# Patient Record
Sex: Male | Born: 1955 | Race: Black or African American | Hispanic: No | Marital: Single | Smoking: Never smoker
Health system: Southern US, Community
[De-identification: ages and names within clinical notes are randomized; demographics above are authoritative.]

## PROBLEM LIST (undated history)

## (undated) DIAGNOSIS — G839 Paralytic syndrome, unspecified: Secondary | ICD-10-CM

## (undated) DIAGNOSIS — I10 Essential (primary) hypertension: Secondary | ICD-10-CM

## (undated) DIAGNOSIS — M4800 Spinal stenosis, site unspecified: Secondary | ICD-10-CM

## (undated) HISTORY — PX: BACK SURGERY: SHX140

---

## 2004-08-25 ENCOUNTER — Encounter: Admission: RE | Admit: 2004-08-25 | Discharge: 2004-08-25 | Payer: Self-pay | Admitting: Orthopaedic Surgery

## 2004-09-20 ENCOUNTER — Encounter: Admission: RE | Admit: 2004-09-20 | Discharge: 2004-09-20 | Payer: Self-pay | Admitting: Orthopaedic Surgery

## 2005-02-23 ENCOUNTER — Encounter
Admission: RE | Admit: 2005-02-23 | Discharge: 2005-02-23 | Payer: Self-pay | Admitting: Physical Medicine and Rehabilitation

## 2010-10-28 ENCOUNTER — Encounter: Payer: Self-pay | Admitting: Orthopaedic Surgery

## 2012-04-29 ENCOUNTER — Encounter (HOSPITAL_COMMUNITY): Payer: Self-pay

## 2012-04-29 ENCOUNTER — Emergency Department (INDEPENDENT_AMBULATORY_CARE_PROVIDER_SITE_OTHER)
Admission: EM | Admit: 2012-04-29 | Discharge: 2012-04-29 | Disposition: A | Discharge status: Home / Self Care | Payer: Medicare Other | Source: Home / Self Care | Attending: Emergency Medicine | Admitting: Emergency Medicine

## 2012-04-29 DIAGNOSIS — M4800 Spinal stenosis, site unspecified: Secondary | ICD-10-CM

## 2012-04-29 HISTORY — DX: Spinal stenosis, site unspecified: M48.00

## 2012-04-29 HISTORY — DX: Essential (primary) hypertension: I10

## 2012-04-29 MED ORDER — OXYCODONE-ACETAMINOPHEN 5-325 MG PO TABS: ORAL_TABLET | ORAL | Status: AC

## 2012-04-29 NOTE — ED Provider Notes (Signed)
Chief Complaint  Patient presents with  . Back Pain    History of Present Illness:   [name] is a 56 year old male with chronic lower back pain due to spinal stenosis. This was first operated on in 1988. He's had severe pain ever since then. He was diagnosed as having spinal stenosis 5-6 years ago. He states he hurts although her and notes lower back pain and stiffness. He has decreased range of motion and pain with just about any movement. He is confined to wheelchair and cannot stand or walk. The pain in his back radiates down into both of his legs as far as the toes with numbness and tingling and weakness in the legs. He has no difficulty urinating. He is somewhat constipated. He does note abdominal pain. The patient states he's just been taking ibuprofen and aspirin for the pain, however review of the [place] data base reveals that he's been seeing a Dr. Ellwood Sayers who has given him hydrocodone/APAP #90 once a month for the past 2 months. His last prescription for this was less than a month ago. After I confronted him about this he told me that yes, he had gotten the prescriptions but they were not doing any good so he flushed them down the commode. He also sees Dr. Andris Baumann in [facility]. He states that she has prescribed anti-inflammatories for his back pain, but they don't do any good. He comes in today hoping that I would give him a prescription for 30 day prescription for Percocet. He states this is the only thing that's ever worked. He is not seeing any chronic pain management specialist right now. He states he has taken Percocet and used fentanyl patches in the past but he decided he wanted to try to get off of these. He also has seen Dr. Noel Gerold for back pain in the past. I asked him if he could followup with Dr. Noel Gerold, but he states he would not want to see him again, but could not give me a good reason. When asked by decided to come in today, he states that he is just tired of  hurting and wants something stronger for pain.  Review of Systems:  Other than noted above, the patient denies any of the following symptoms: Systemic:  No fever, chills, fatigue, or weight loss. GI:  No abdominal pain, nausea, vomiting, diarrhea, constipation or blood in stool. GU:  No dysuria, frequency, urgency, or hematuria. No incontinence or difficulty urinating.  M-S:  No neck pain, joint pain, arthritis, or myalgias. Neuro:  No parethesias or muscular weakness. Skin:  No rash or itching.   PMFSH:  Past medical history, family history, social history, meds, and allergies were reviewed.  Physical Exam:   Vital signs:  BP 148/85  Pulse 71  Temp 98.4 F (36.9 C) (Oral)  Resp 18  SpO2 98% General:  Alert, oriented, in no distress. He is confined to wheelchair and could not stand up for a complete back exam. Abdomen:  Soft, non-tender.  No organomegaly or mass.  No pulsatile midline abdominal mass or bruit. Back:  His back is diffusely tender to palpation. There is no swelling or deformity. Neuro:  Normal muscle strength, sensations and DTRs were unobtainable. Extremities: Pedal pulses were full, there was no edema. Skin:  Clear, warm and dry.  No rash.  Assessment:  The encounter diagnosis was Spinal stenosis. I explained to him that we did not treat chronic pain here and that he would need  to find a chronic pain management specialist. I offered to give him tramadol, but he states that he cannot tolerate these because they cause seizures. He states the hydrocodone does no good motor anti-inflammatories. I did give him a prescription for 15 Percocets. But I told him he should make these last until he can get a chronic pain specialist.  Plan:   1.  The following meds were prescribed:   New Prescriptions   OXYCODONE-ACETAMINOPHEN (PERCOCET) 5-325 MG PER TABLET    1 to 2 tablets every 6 hours as needed for pain.   2.  The patient was instructed in symptomatic care and handouts were  given. 3.  The patient was told to return if becoming worse in any way, if no better in 2 weeks, and given some red flag symptoms that would indicate earlier return. 4.  The patient was encouraged to try to be as active as possible and given some exercises to do followed by moist heat.   Reuben Likes, MD 04/29/12 2026

## 2012-04-29 NOTE — ED Notes (Signed)
Reports long duration of pain in back (several years) no longer under MD care for back issues

## 2014-09-05 ENCOUNTER — Emergency Department (HOSPITAL_COMMUNITY)
Admission: EM | Admit: 2014-09-05 | Discharge: 2014-09-05 | Disposition: A | Discharge status: Home / Self Care | Payer: Medicare Other | Attending: Emergency Medicine | Admitting: Emergency Medicine

## 2014-09-05 ENCOUNTER — Encounter (HOSPITAL_COMMUNITY): Payer: Self-pay | Admitting: *Deleted

## 2014-09-05 DIAGNOSIS — L89159 Pressure ulcer of sacral region, unspecified stage: Secondary | ICD-10-CM | POA: Diagnosis not present

## 2014-09-05 DIAGNOSIS — Z8739 Personal history of other diseases of the musculoskeletal system and connective tissue: Secondary | ICD-10-CM | POA: Diagnosis not present

## 2014-09-05 DIAGNOSIS — L8990 Pressure ulcer of unspecified site, unspecified stage: Secondary | ICD-10-CM

## 2014-09-05 DIAGNOSIS — Z8669 Personal history of other diseases of the nervous system and sense organs: Secondary | ICD-10-CM | POA: Diagnosis not present

## 2014-09-05 DIAGNOSIS — Z79899 Other long term (current) drug therapy: Secondary | ICD-10-CM | POA: Insufficient documentation

## 2014-09-05 DIAGNOSIS — I10 Essential (primary) hypertension: Secondary | ICD-10-CM | POA: Insufficient documentation

## 2014-09-05 DIAGNOSIS — E119 Type 2 diabetes mellitus without complications: Secondary | ICD-10-CM | POA: Insufficient documentation

## 2014-09-05 DIAGNOSIS — L089 Local infection of the skin and subcutaneous tissue, unspecified: Secondary | ICD-10-CM | POA: Diagnosis present

## 2014-09-05 HISTORY — DX: Paralytic syndrome, unspecified: G83.9

## 2014-09-05 NOTE — Discharge Instructions (Signed)
Skin Ulcer A skin ulcer is an open sore that can be shallow or deep. Skin ulcers sometimes become infected and are difficult to treat. It may be 1 month or longer before real healing progress is made. CAUSES   Injury.  Problems with the veins or arteries.  Diabetes.  Insect bites.  Bedsores.  Inflammatory conditions. SYMPTOMS   Pain, redness, swelling, and tenderness around the ulcer.  Fever.  Bleeding from the ulcer.  Yellow or clear fluid coming from the ulcer. DIAGNOSIS  There are many types of skin ulcers. Any open sores will be examined. Certain tests will be done to determine the kind of ulcer you have. The right treatment depends on the type of ulcer you have. TREATMENT  Treatment is a long-term challenge. It may include:  Wearing an elastic wrap, compression stockings, or gel cast over the ulcer area.  Taking antibiotic medicines or putting antibiotic creams on the affected area if there is an infection. HOME CARE INSTRUCTIONS  Put on your bandages (dressings), wraps, or casts over the ulcer as directed by your caregiver.  Change all dressings as directed by your caregiver.  Take all medicines as directed by your caregiver.  Keep the affected area clean and dry.  Avoid injuries to the affected area.  Eat a well-balanced, healthy diet that includes plenty of fruit and vegetables.  If you smoke, consider quitting or decreasing the amount of cigarettes you smoke.  Once the ulcer heals, get regular exercise as directed by your caregiver.  Work with your caregiver to make sure your blood pressure, cholesterol, and diabetes are well-controlled.  Keep your skin moisturized. Dry skin can crack and lead to skin ulcers. SEEK IMMEDIATE MEDICAL CARE IF:   Your pain gets worse.  You have swelling, redness, or fluids around the ulcer.  You have chills.  You have a fever. MAKE SURE YOU:   Understand these instructions.  Will watch your condition.  Will get  help right away if you are not doing well or get worse. Document Released: 11/01/2004 Document Revised: 12/17/2011 Document Reviewed: 05/11/2011 [facility] Patient Information 2015 [place], [place]. This information is not intended to replace advice given to you by your health care provider. Make sure you discuss any questions you have with your health care provider.  Pressure Ulcer A pressure ulcer is a sore that has formed from the breakdown of skin and exposure of deeper layers of tissue. It develops in areas of the body where there is unrelieved pressure. Pressure ulcers are usually found over a bony area, such as the shoulder blades, spine, lower back, hips, knees, ankles, and heels. Pressure ulcers vary in severity. Your health care provider may determine the severity (stage) of your pressure ulcer. The stages include:  Stage I--The skin is red, and when the skin is pressed, it stays red.  Stage II--The top layer of skin is gone, and there is a shallow, pink ulcer.  Stage III--The ulcer becomes deeper, and it is more difficult to see the whole wound. Also, there may be yellow or brown parts, as well as pink and red parts.  Stage IV--The ulcer may be deep and red, pink, brown, white, or yellow. Bone or muscle may be seen.  Unstageable pressure ulcer--The ulcer is covered almost completely with black, brown, or yellow tissue. It is not known how deep the ulcer is or what stage it is until this covering comes off.  Suspected deep tissue injury--A person's skin can be injured from pressure or  pulling on the skin when his or her position is changed. The skin appears purple or maroon. There may not be an opening in the skin, but there could be a blood-filled blister. This deep tissue injury is often difficult to see in people with darker skin tones. The site may open and become deeper in time. However, early interventions will help the area heal and may prevent the area from opening. CAUSES    Pressure ulcers are caused by pressure against the skin that limits the flow of blood to the skin and nearby tissues. There are many risk factors that can lead to pressure sores. RISK FACTORS  Decreased ability to move.  Decreased ability to feel pain or discomfort.  Excessive skin moisture from urine, stool, sweat, or secretions.  Poor nutrition.  Dehydration.  Tobacco, drug, or alcohol abuse.  Having someone pull on bedsheets that are under you, such as when health care workers are changing your position in a hospital bed.  Obesity.  Increased adult age.  Hospitalization in a critical care unit for longer than 4 days with use of medical devices.  Prolonged use of medical devices.  Critical illness.  Anemia.  Traumatic brain injury.  Spinal cord injury.  Stroke.  Diabetes.  Poor blood glucose control.  Low blood pressure (hypotension).  Low oxygen levels.  Medicines that reduce blood flow.  Infection. DIAGNOSIS  Your health care provider will diagnose your pressure ulcer based on its appearance. The health care provider may determine the stage of your pressure ulcer as well. Tests may be done to check for infection, to assess your circulation, or to check for other diseases, such as diabetes. TREATMENT  Treatment of your pressure ulcer begins with determining what stage the ulcer is in. Your treatment team may include your health care provider, a wound care specialist, a nutritionist, a physical therapist, and a Careers advisersurgeon. Possible treatments may include:   Moving or repositioning every 1-2 hours.  Using beds or mattresses to shift your body weight and pressure points frequently.  Improving your diet.  Cleaning and bandaging (dressing) the open wound.  Giving antibiotic medicines.  Removing damaged tissue.  Surgery and sometimes skin grafts. HOME CARE INSTRUCTIONS  If you were hospitalized, follow the care plan that was started in the  hospital.  Avoid staying in the same position for more than 2 hours. Use padding, devices, or mattresses to cushion your pressure points as directed by your health care provider.  Eat a well-balanced diet. Take nutritional supplements and vitamins as directed by your health care provider.  Keep all follow-up appointments.  Only take over-the-counter or prescription medicines for pain, fever, or discomfort as directed by your health care provider. SEEK MEDICAL CARE IF:   Your pressure ulcer is not improving.  You do not know how to care for your pressure ulcer.  You notice other areas of redness on your skin.  You have a fever. SEEK IMMEDIATE MEDICAL CARE IF:   You have increasing redness, swelling, or pain in your pressure ulcer.  You notice pus coming from your pressure ulcer.  You notice a bad smell coming from the wound or dressing.  Your pressure ulcer opens up again. Document Released: 09/24/2005 Document Revised: 09/29/2013 Document Reviewed: 06/01/2013 [facility] Patient Information 2015 [place], [place]LL[place]. This information is not intended to replace advice given to you by your health care provider. Make sure you discuss any questions you have with your health care provider.

## 2014-09-05 NOTE — ED Notes (Signed)
Pt reports having a pressure ulcer on his buttock that he wants to be evaluated. Pt denies any fever/chills. No acute distress noted at triage.

## 2014-09-05 NOTE — ED Provider Notes (Signed)
CSN: [id number]     Arrival date & time 09/05/14  1637 History   First MD Initiated Contact with Patient 09/05/14 2051     Chief Complaint  Patient presents with  . Wound Infection     (Consider location/radiation/quality/duration/timing/severity/associated sxs/prior Treatment) HPI Comments: Patient with past medical history of hypertension, diabetes, and very limited lower extremity movement, presents emergency department for evaluation of sacral decubitus ulcer on his right buttock. He states that the ulcer has been present for the past 2-3 months. He states that it has been gradually worsening. He thinks that it came on when his wheelchair "pinched" him.  He states that his symptoms have been aggravated by the fact that he has not been able to sleep in his hospital bed lately, but has had to sleep in his recliner. He denies any discharge drainage. Denies any fevers or chills.  The history is provided by the patient. No language interpreter was used.    Past Medical History  Diagnosis Date  . Hypertension   . Spinal stenosis   . Diabetes mellitus   . Paralysis    History reviewed. No pertinent past surgical history. History reviewed. No pertinent family history. History  Substance Use Topics  . Smoking status: Never Smoker   . Smokeless tobacco: Not on file  . Alcohol Use: No    Review of Systems  Constitutional: Negative for fever and chills.  Respiratory: Negative for shortness of breath.   Cardiovascular: Negative for chest pain.  Gastrointestinal: Negative for nausea, vomiting, diarrhea and constipation.  Genitourinary: Negative for dysuria.  Skin: Positive for wound.  All other systems reviewed and are negative.     Allergies  Review of patient's allergies indicates no known allergies.  Home Medications   Prior to Admission medications   Medication Sig Start Date End Date Taking? Authorizing Provider  amLODipine (NORVASC) 10 MG tablet Take 10 mg by mouth  daily.    Historical Provider, MD  metoprolol (LOPRESSOR) 50 MG tablet Take 50 mg by mouth 2 (two) times daily.    Historical Provider, MD  potassium chloride (K-DUR) 10 MEQ tablet Take 10 mEq by mouth 2 (two) times daily.    Historical Provider, MD  rosuvastatin (CRESTOR) 10 MG tablet Take 10 mg by mouth daily.    Historical Provider, MD  valsartan (DIOVAN) 80 MG tablet Take 10 mg by mouth daily.    Historical Provider, MD   BP 169/90 mmHg  Pulse 93  Temp(Src) 98.4 F (36.9 C) (Oral)  Resp 16  Ht 6' 3.5" (1.918 m)  Wt 270 lb (122.471 kg)  BMI 33.29 kg/m2  SpO2 96% Physical Exam  Constitutional: He is oriented to person, place, and time. He appears well-developed and well-nourished.  HENT:  Head: Normocephalic and atraumatic.  Eyes: Conjunctivae and EOM are normal. Pupils are equal, round, and reactive to light. Right eye exhibits no discharge. Left eye exhibits no discharge. No scleral icterus.  Neck: Normal range of motion. Neck supple. No JVD present.  Cardiovascular: Normal rate, regular rhythm and normal heart sounds.  Exam reveals no gallop and no friction rub.   No murmur heard. Pulmonary/Chest: Effort normal and breath sounds normal. No respiratory distress. He has no wheezes. He has no rales. He exhibits no tenderness.  Abdominal: Soft. He exhibits no distension and no mass. There is no tenderness. There is no rebound and no guarding.  Musculoskeletal: Normal range of motion. He exhibits no edema or tenderness.  Neurological: He is alert and  oriented to person, place, and time.  Skin: Skin is warm and dry.  Sacral decubitus ulcer as pictured  Psychiatric: He has a normal mood and affect. His behavior is normal. Judgment and thought content normal.  Nursing note and vitals reviewed.   ED Course  Procedures (including critical care time) Labs Review Labs Reviewed - No data to display  Imaging Review No results found.       EKG Interpretation None      MDM    Final diagnoses:  Decubitus ulcer    Patient states that he is requesting a referral to wound care. I will work on this for the patient. Will also consult wound care team to dress the wound. There is no sign of local or systemic infection.  Patient will be able to be discharged home.  Patient discussed with Dr. Juleen ChinaKohut, who agrees with the plan.    Roxy HorsemanRobert , PA-C 09/05/14 2149  Raeford RazorStephen Kohut, MD 09/08/14 [phone]

## 2014-09-16 ENCOUNTER — Encounter (HOSPITAL_BASED_OUTPATIENT_CLINIC_OR_DEPARTMENT_OTHER): Payer: Medicare Other | Attending: Internal Medicine

## 2016-07-16 ENCOUNTER — Emergency Department (HOSPITAL_BASED_OUTPATIENT_CLINIC_OR_DEPARTMENT_OTHER)
Admission: EM | Admit: 2016-07-16 | Discharge: 2016-07-16 | Disposition: A | Discharge status: Home / Self Care | Payer: Medicare Other | Attending: Emergency Medicine | Admitting: Emergency Medicine

## 2016-07-16 ENCOUNTER — Encounter (HOSPITAL_BASED_OUTPATIENT_CLINIC_OR_DEPARTMENT_OTHER): Payer: Self-pay | Admitting: *Deleted

## 2016-07-16 DIAGNOSIS — I10 Essential (primary) hypertension: Secondary | ICD-10-CM | POA: Insufficient documentation

## 2016-07-16 DIAGNOSIS — Z79899 Other long term (current) drug therapy: Secondary | ICD-10-CM | POA: Insufficient documentation

## 2016-07-16 DIAGNOSIS — Z7984 Long term (current) use of oral hypoglycemic drugs: Secondary | ICD-10-CM | POA: Diagnosis not present

## 2016-07-16 DIAGNOSIS — M79642 Pain in left hand: Secondary | ICD-10-CM | POA: Diagnosis present

## 2016-07-16 DIAGNOSIS — E119 Type 2 diabetes mellitus without complications: Secondary | ICD-10-CM | POA: Insufficient documentation

## 2016-07-16 DIAGNOSIS — L03012 Cellulitis of left finger: Secondary | ICD-10-CM | POA: Insufficient documentation

## 2016-07-16 MED ORDER — HYDROCODONE-ACETAMINOPHEN 7.5-325 MG PO TABS: 1.0000 | ORAL_TABLET | Freq: Four times a day (QID) | ORAL | 0 refills | Status: DC | PRN

## 2016-07-16 MED ORDER — CEPHALEXIN 500 MG PO CAPS: 500.0000 mg | ORAL_CAPSULE | Freq: Four times a day (QID) | ORAL | 0 refills | Status: DC

## 2016-07-16 MED FILL — CEPHALEXIN 500 MG CAPSULE: 500 | 7 days supply | Qty: 28 | Fill #0

## 2016-07-16 MED FILL — HYDROCODON-APAP 7.5-325: 7.5-325 | 3 days supply | Qty: 12 | Fill #0

## 2016-07-16 NOTE — Discharge Instructions (Signed)
Keflex as prescribed.  Hydrocodone as prescribed as needed for pain.  Apply warm soaks as frequently as possible for the next several days.  Return to the emergency department for increasing pain, swelling, or other new and concerning symptoms.

## 2016-07-16 NOTE — ED Provider Notes (Signed)
[facility] Provider Note   CSN: [id number] Arrival date & time: 07/16/16  1434   By signing my name below, I, Teofilo PodMatthew P. Jamison, attest that this documentation has been prepared under the direction and in the presence of Geoffery Lyonsouglas , MD . Electronically Signed: Teofilo PodMatthew P. Jamison, ED Scribe. 07/16/2016. 3:25 PM.   History   Chief Complaint Chief Complaint  Patient presents with  . Hand Pain    The history is provided by the patient. No language interpreter was used.  Hand Pain  This is a new problem. The current episode started more than 2 days ago. The problem occurs constantly. The problem has not changed since onset.Nothing aggravates the symptoms. Nothing relieves the symptoms. He has tried nothing for the symptoms.  HPI Comments:  Jeffrey Fields is a 60 y.o. male with PMHx of DM and paraplegia who presents to the Emergency Department complaining of constant left thumb pain onset "a few days" ago.   Past Medical History:  Diagnosis Date  . Diabetes mellitus   . Hypertension   . Paralysis (HCC)   . Spinal stenosis     There are no active problems to display for this patient.   History reviewed. No pertinent surgical history.     Home Medications    Prior to Admission medications   Medication Sig Start Date End Date Taking? Authorizing Provider  amLODipine (NORVASC) 10 MG tablet Take 10 mg by mouth daily.    Historical Provider, MD  furosemide (LASIX) 20 MG tablet Take 20 mg by mouth daily. 06/09/14   Historical Provider, MD  metFORMIN (GLUCOPHAGE) 500 MG tablet Take 500 mg by mouth 2 (two) times daily. 08/15/14   Historical Provider, MD  metoprolol (LOPRESSOR) 50 MG tablet Take 50 mg by mouth 2 (two) times daily.    Historical Provider, MD  oxyCODONE-acetaminophen (PERCOCET) 7.5-325 MG per tablet Take 1 tablet by mouth every 6 (six) hours as needed. 08/20/14   Historical Provider, MD  potassium chloride (K-DUR) 10 MEQ tablet Take 10 mEq by mouth 2  (two) times daily.    Historical Provider, MD  rosuvastatin (CRESTOR) 10 MG tablet Take 10 mg by mouth daily.    Historical Provider, MD  valsartan (DIOVAN) 80 MG tablet Take 80 mg by mouth daily.     Historical Provider, MD    Family History No family history on file.  Social History Social History  Substance Use Topics  . Smoking status: Never Smoker  . Smokeless tobacco: Never Used  . Alcohol use No     Allergies   Review of patient's allergies indicates no known allergies.   Review of Systems Review of Systems  All other systems reviewed and are negative.    Physical Exam Updated Vital Signs BP 135/84 (BP Location: Left Arm)   Pulse 87   Temp 98.6 F (37 C) (Oral)   Resp 18   Ht 6\' 2"  (1.88 m)   Wt 270 lb (122.5 kg)   SpO2 100%   BMI 34.67 kg/m   Physical Exam  Constitutional: He appears well-developed and well-nourished. No distress.  HENT:  Head: Normocephalic and atraumatic.  Eyes: Conjunctivae are normal.  Cardiovascular: Normal rate.   Pulmonary/Chest: Effort normal.  Abdominal: He exhibits no distension.  Musculoskeletal:  Swollen, tender, fluctuant area to the cuticle of the left thumb. There is surrounding erythema.   Neurological: He is alert.  Skin: Skin is warm and dry.  Psychiatric: He has a normal mood and affect.  Nursing note and vitals reviewed.    ED Treatments / Results  DIAGNOSTIC STUDIES:  Oxygen Saturation is 100% on RA, normal by my interpretation.    COORDINATION OF CARE:  3:25 PM Discussed treatment plan with pt at bedside and pt agreed to plan.   Labs (all labs ordered are listed, but only abnormal results are displayed) Labs Reviewed - No data to display  EKG  EKG Interpretation None       Radiology No results found.  Procedures Procedures (including critical care time)  Medications Ordered in ED Medications - No data to display   Initial Impression / Assessment and Plan / ED Course  I have reviewed  the triage vital signs and the nursing notes.  Pertinent labs & imaging results that were available during my care of the patient were reviewed by me and considered in my medical decision making (see chart for details).  Clinical Course   INCISION AND DRAINAGE Performed by: Geoffery Lyons Consent: Verbal consent obtained. Risks and benefits: risks, benefits and alternatives were discussed Type: abscess  Body area: Left thumb paronychia  Anesthesia: local infiltration  Incision was made with a scalpel.  Local anesthetic: None   Anesthetic total: 0 ml  Complexity: complex Blunt dissection to break up loculations  Drainage: purulent  Drainage amount: Moderate   Packing material: No packing placed   Patient tolerance: Patient tolerated the procedure well with no immediate complications.     The paronychia was incised and drained as above. He will be discharged on Keflex, advised to perform warm soaks, and return as needed for any problems.  Final Clinical Impressions(s) / ED Diagnoses   Final diagnoses:  None    New Prescriptions New Prescriptions   No medications on file  I personally performed the services described in this documentation, which was scribed in my presence. The recorded information has been reviewed and is accurate.        Geoffery Lyons, MD 07/16/16 2350

## 2016-07-16 NOTE — ED Triage Notes (Signed)
Paronychia left thumb.

## 2016-11-16 ENCOUNTER — Emergency Department (HOSPITAL_BASED_OUTPATIENT_CLINIC_OR_DEPARTMENT_OTHER)
Admission: EM | Admit: 2016-11-16 | Discharge: 2016-11-16 | Disposition: A | Discharge status: Home / Self Care | Payer: Medicare Other | Attending: Emergency Medicine | Admitting: Emergency Medicine

## 2016-11-16 ENCOUNTER — Emergency Department (HOSPITAL_BASED_OUTPATIENT_CLINIC_OR_DEPARTMENT_OTHER): Payer: Medicare Other

## 2016-11-16 ENCOUNTER — Encounter (HOSPITAL_BASED_OUTPATIENT_CLINIC_OR_DEPARTMENT_OTHER): Payer: Self-pay | Admitting: *Deleted

## 2016-11-16 DIAGNOSIS — R319 Hematuria, unspecified: Secondary | ICD-10-CM | POA: Diagnosis present

## 2016-11-16 DIAGNOSIS — N3091 Cystitis, unspecified with hematuria: Secondary | ICD-10-CM | POA: Diagnosis not present

## 2016-11-16 DIAGNOSIS — Z7984 Long term (current) use of oral hypoglycemic drugs: Secondary | ICD-10-CM | POA: Insufficient documentation

## 2016-11-16 DIAGNOSIS — Z79899 Other long term (current) drug therapy: Secondary | ICD-10-CM | POA: Diagnosis not present

## 2016-11-16 DIAGNOSIS — E119 Type 2 diabetes mellitus without complications: Secondary | ICD-10-CM | POA: Diagnosis not present

## 2016-11-16 DIAGNOSIS — I10 Essential (primary) hypertension: Secondary | ICD-10-CM | POA: Diagnosis not present

## 2016-11-16 LAB — URINALYSIS, MICROSCOPIC (REFLEX)

## 2016-11-16 LAB — CBC WITH DIFFERENTIAL/PLATELET
Basophils Absolute: 0 10*3/uL (ref 0.0–0.1)
Basophils Relative: 0 %
Eosinophils Absolute: 0.2 10*3/uL (ref 0.0–0.7)
Eosinophils Relative: 2 %
HCT: 36.6 % — ABNORMAL LOW (ref 39.0–52.0)
HEMOGLOBIN: 11.3 g/dL — AB (ref 13.0–17.0)
Lymphocytes Relative: 25 %
Lymphs Abs: 1.9 10*3/uL (ref 0.7–4.0)
MCH: 23.8 pg — ABNORMAL LOW (ref 26.0–34.0)
MCHC: 30.9 g/dL (ref 30.0–36.0)
MCV: 77.2 fL — ABNORMAL LOW (ref 78.0–100.0)
MONO ABS: 0.6 10*3/uL (ref 0.1–1.0)
Monocytes Relative: 8 %
Neutro Abs: 5 10*3/uL (ref 1.7–7.7)
Neutrophils Relative %: 65 %
Platelets: 286 10*3/uL (ref 150–400)
RBC: 4.74 MIL/uL (ref 4.22–5.81)
RDW: 15.1 % (ref 11.5–15.5)
WBC: 7.6 10*3/uL (ref 4.0–10.5)

## 2016-11-16 LAB — URINALYSIS, ROUTINE W REFLEX MICROSCOPIC
Bilirubin Urine: NEGATIVE
GLUCOSE, UA: NEGATIVE mg/dL
Ketones, ur: NEGATIVE mg/dL
Nitrite: NEGATIVE
Protein, ur: 30 mg/dL — AB
SPECIFIC GRAVITY, URINE: 1.012 (ref 1.005–1.030)
pH: 7.5 (ref 5.0–8.0)

## 2016-11-16 LAB — COMPREHENSIVE METABOLIC PANEL
ALBUMIN: 3.9 g/dL (ref 3.5–5.0)
ALT: 18 U/L (ref 17–63)
ANION GAP: 7 (ref 5–15)
AST: 20 U/L (ref 15–41)
Alkaline Phosphatase: 95 U/L (ref 38–126)
BUN: 13 mg/dL (ref 6–20)
CO2: 32 mmol/L (ref 22–32)
Calcium: 9 mg/dL (ref 8.9–10.3)
Chloride: 99 mmol/L — ABNORMAL LOW (ref 101–111)
Creatinine, Ser: 0.89 mg/dL (ref 0.61–1.24)
GFR calc Af Amer: >60 mL/min (ref >60)
GFR calc non Af Amer: >60 mL/min (ref >60)
Glucose, Bld: 92 mg/dL (ref 65–99)
Potassium: 3.3 mmol/L — ABNORMAL LOW (ref 3.5–5.1)
Sodium: 138 mmol/L (ref 135–145)
Total Bilirubin: 0.9 mg/dL (ref 0.3–1.2)
Total Protein: 8.2 g/dL — ABNORMAL HIGH (ref 6.5–8.1)

## 2016-11-16 MED ORDER — DEXTROSE 5 % IV SOLN
1.0000 g | Freq: Once | INTRAVENOUS | Status: AC
  Administered 2016-11-16: 1 g via INTRAVENOUS
  Filled 2016-11-16: qty 10

## 2016-11-16 MED ORDER — CEPHALEXIN 500 MG PO CAPS: 500.0000 mg | ORAL_CAPSULE | Freq: Three times a day (TID) | ORAL | 0 refills | Status: DC

## 2016-11-16 NOTE — ED Notes (Signed)
ED Provider at bedside. 

## 2016-11-16 NOTE — Discharge Instructions (Signed)
You had a CT scan performed today that demonstrates a kidney stone on the right and shows that your wound goes close to your bones.  Please follow up with your family doctor regarding your CT scan and to recheck your urine.

## 2016-11-16 NOTE — ED Triage Notes (Signed)
Back pain and hematuria for a long time but worse over the past 5 days. 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

## 2016-11-16 NOTE — ED Provider Notes (Signed)
[facility] Provider Note   CSN: [id number] Arrival date & time: 11/16/16  1345     History   Chief Complaint Chief Complaint  Patient presents with  . Hematuria  . Back Pain    HPI Jeffrey Fields is a 61 y.o. male.  The history is provided by the patient. No language interpreter was used.  Hematuria   Back Pain      Jeffrey Fields is a 61 y.o. male who presents to the Emergency Department complaining of hematuria.  He has a hx/o DM and spinal stenosis and requires a wheelchair for mobility due to lower extremity weakness.  He is able to void on his own without difficulty. Over the last several weeks he has developed foul smelling urine and over the last four days he has had a few drops of blood at the initiation of urination that then clears.  No clots.  Today he developed nausea.  He has tightness in his lower abdomen.  No reported fevers, vomiting.  He has chronic back pain - unchanged from baseline.  Past Medical History:  Diagnosis Date  . Diabetes mellitus   . Hypertension   . Paralysis (HCC)   . Spinal stenosis     There are no active problems to display for this patient.   Past Surgical History:  Procedure Laterality Date  . BACK SURGERY         Home Medications    Prior to Admission medications   Medication Sig Start Date End Date Taking? Authorizing Provider  amLODipine (NORVASC) 10 MG tablet Take 10 mg by mouth daily.   Yes Historical Provider, MD  furosemide (LASIX) 20 MG tablet Take 20 mg by mouth daily. 06/09/14  Yes Historical Provider, MD  metFORMIN (GLUCOPHAGE) 500 MG tablet Take 500 mg by mouth 2 (two) times daily. 08/15/14  Yes Historical Provider, MD  metoprolol (LOPRESSOR) 50 MG tablet Take 50 mg by mouth 2 (two) times daily.   Yes Historical Provider, MD  oxyCODONE-acetaminophen (PERCOCET) 7.5-325 MG per tablet Take 1 tablet by mouth every 6 (six) hours as needed. 08/20/14  Yes Historical Provider, MD  potassium chloride  (K-DUR) 10 MEQ tablet Take 10 mEq by mouth 2 (two) times daily.   Yes Historical Provider, MD  rosuvastatin (CRESTOR) 10 MG tablet Take 10 mg by mouth daily.   Yes Historical Provider, MD  valsartan (DIOVAN) 80 MG tablet Take 80 mg by mouth daily.    Yes Historical Provider, MD  cephALEXin (KEFLEX) 500 MG capsule Take 1 capsule (500 mg total) by mouth 3 (three) times daily. 11/16/16   Tilden Fossa, MD  HYDROcodone-acetaminophen (NORCO) 7.5-325 MG tablet Take 1 tablet by mouth every 6 (six) hours as needed for moderate pain. 07/16/16   Geoffery Lyons, MD    Family History No family history on file.  Social History Social History  Substance Use Topics  . Smoking status: Never Smoker  . Smokeless tobacco: Never Used  . Alcohol use No     Allergies   Patient has no known allergies.   Review of Systems Review of Systems  Genitourinary: Positive for hematuria.  Musculoskeletal: Positive for back pain.  All other systems reviewed and are negative.    Physical Exam Updated Vital Signs BP 128/82 (BP Location: Left Arm)   Pulse 72   Temp 98.1 F (36.7 C) (Oral)   Resp 18   Ht 6' 3.5" (1.918 m)   Wt 275 lb (124.7 kg)   SpO2  100%   BMI 33.92 kg/m   Physical Exam  Constitutional: He is oriented to person, place, and time. He appears well-developed and well-nourished.  HENT:  Head: Normocephalic and atraumatic.  Cardiovascular: Normal rate and regular rhythm.   No murmur heard. Pulmonary/Chest: Effort normal and breath sounds normal. No respiratory distress.  Abdominal: Soft. There is no tenderness. There is no rebound and no guarding.  Musculoskeletal: He exhibits no edema or tenderness.  Neurological: He is alert and oriented to person, place, and time.  Skin: Skin is warm and dry.  Psychiatric: He has a normal mood and affect. His behavior is normal.  Nursing note and vitals reviewed.    ED Treatments / Results  Labs (all labs ordered are listed, but only abnormal  results are displayed) Labs Reviewed  URINALYSIS, ROUTINE W REFLEX MICROSCOPIC - Abnormal; Notable for the following:       Result Value   APPearance TURBID (*)    Hgb urine dipstick LARGE (*)    Protein, ur 30 (*)    Leukocytes, UA LARGE (*)    All other components within normal limits  URINALYSIS, MICROSCOPIC (REFLEX) - Abnormal; Notable for the following:    Bacteria, UA MANY (*)    Squamous Epithelial / LPF 6-30 (*)    Non Squamous Epithelial PRESENT (*)    All other components within normal limits  COMPREHENSIVE METABOLIC PANEL - Abnormal; Notable for the following:    Potassium 3.3 (*)    Chloride 99 (*)    Total Protein 8.2 (*)    All other components within normal limits  CBC WITH DIFFERENTIAL/PLATELET - Abnormal; Notable for the following:    Hemoglobin 11.3 (*)    HCT 36.6 (*)    MCV 77.2 (*)    MCH 23.8 (*)    All other components within normal limits  URINE CULTURE    EKG  EKG Interpretation None       Radiology Ct Renal Stone Study  Result Date: 11/16/2016 CLINICAL DATA:  61 year old male with back and flank pain and hematuria. EXAM: CT ABDOMEN AND PELVIS WITHOUT CONTRAST TECHNIQUE: Multidetector CT imaging of the abdomen and pelvis was performed following the standard protocol without IV contrast. COMPARISON:  06/08/2009 CT.  10/05/2015 and 01/31/2015 radiographs FINDINGS: Please note that parenchymal abnormalities may be missed without intravenous contrast. Lower chest: No acute abnormality. Hepatobiliary: The liver and gallbladder are unremarkable. There is no evidence of biliary dilatation. Pancreas: Unremarkable Spleen: Unremarkable Adrenals/Urinary Tract: Nonobstructing right renal calculi are identified, the largest measuring 8 mm. There is no evidence of hydronephrosis or obstructing urinary calculi. The adrenal glands and bladder are unremarkable. Stomach/Bowel: Stomach is within normal limits. Appendix appears normal. No evidence of bowel wall thickening,  distention, or inflammatory changes. Vascular/Lymphatic: Aortic atherosclerosis. No enlarged abdominal or pelvic lymph nodes. Reproductive: Prostate is unremarkable. Other: No abdominal wall hernia or abnormality. No abdominopelvic ascites. Musculoskeletal: A right gluteal/decubitus ulcer is noted with sclerosis of the right ischial tuberosity suspicious for chronic osteomyelitis. No acute fracture identified. Degenerative changes within the visualized spine identified. IMPRESSION: Right gluteal/decubitus ulcer with sclerosis of the adjacent right ischial tuberosity -suspicious for chronic osteomyelitis. No evidence of acute abnormality within the abdomen or pelvis. Nonobstructing right renal calculi. Abdominal aortic atherosclerosis. Electronically Signed   By: Harmon PierJeffrey  Hu M.D.   On: 11/16/2016 16:52    Procedures Procedures (including critical care time)  Medications Ordered in ED Medications  cefTRIAXone (ROCEPHIN) 1 g in dextrose 5 % 50 mL  IVPB (0 g Intravenous Stopped 11/16/16 1714)     Initial Impression / Assessment and Plan / ED Course  I have reviewed the triage vital signs and the nursing notes.  Pertinent labs & imaging results that were available during my care of the patient were reviewed by me and considered in my medical decision making (see chart for details).     Patient with history of spinal stenosis with lower extremity weakness as well as chronic sacral wound here with hematuria, nausea starting today. UA concerning for UTI, started on antibiotics. No evidence of obstructing stone. CT scan does demonstrate that his sacral wound goes to bone. Patient is aware of this and is followed for his wound at [facility]. Will treat for urinary tract infection with antibiotics, culture sent. Counseled pt on home care for UTI with hematuria. Discussed importance of PCP follow-up for recheck as well as urology follow-up if he has ongoing hematuria. Return precautions  discussed.  Final Clinical Impressions(s) / ED Diagnoses   Final diagnoses:  Hemorrhagic cystitis    New Prescriptions Discharge Medication List as of 11/16/2016  5:30 PM       Tilden Fossa, MD 11/16/16 1825

## 2016-11-18 LAB — URINE CULTURE: Culture: >=100000 — AB

## 2016-11-19 ENCOUNTER — Telehealth: Payer: Self-pay | Admitting: Emergency Medicine

## 2016-11-19 NOTE — Telephone Encounter (Signed)
Post ED Visit - Positive Culture Follow-up  Culture report reviewed by antimicrobial stewardship pharmacist:  [x]  Enzo BiNathan Batchelder, Pharm.D. []  Celedonio MiyamotoJeremy Frens, Pharm.D., BCPS []  Garvin FilaMike Maccia, Pharm.D. []  Georgina PillionElizabeth Martin, Pharm.D., BCPS []  HostetterMinh Pham, [address] Rainbow BoulevardPharm.D., BCPS, AAHIVP []  Estella HuskMichelle Turner, Pharm.D., BCPS, AAHIVP []  Cassie Stewart, Pharm.D. []  Sherle Poeob Vincent, [place].  Positive urine culture Treated with cephalexin, organism sensitive to the same and no further patient follow-up is required at this time.  Berle MullMiller,  11/19/2016, 4:57 PM

## 2018-01-26 IMAGING — CT CT RENAL STONE PROTOCOL
2 of 4 series · 16 of 46 positions shown, 18 images · non-contrast
Comparison: 06/08/2009 CT.  10/05/2015 and 01/31/2015 radiographs

CLINICAL DATA: 60-year-old male with back and flank pain and
hematuria.

EXAM:
CT ABDOMEN AND PELVIS WITHOUT CONTRAST
TECHNIQUE: Multidetector CT imaging of the abdomen and pelvis was performed
following the standard protocol without IV contrast.

[Series 2: axial st · axial · 0.96mm/px · z∈[-470,-85]mm · 13 of 88 slices shown, 15 images]
[im 7/88  soft-tissue]
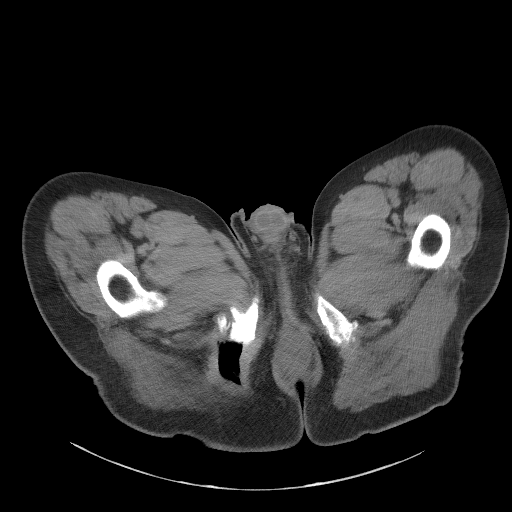
[im 7/88  bone]
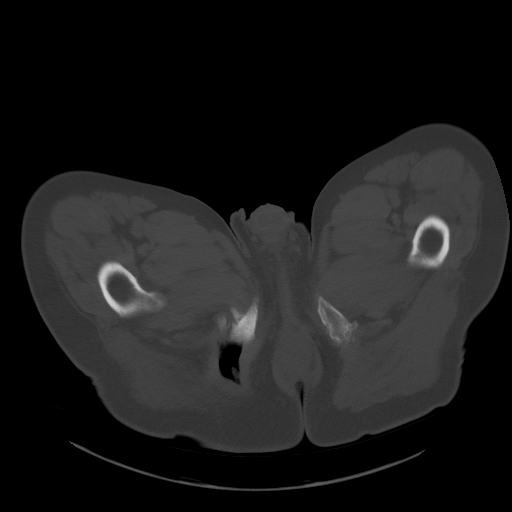
[im 13/88  soft-tissue]
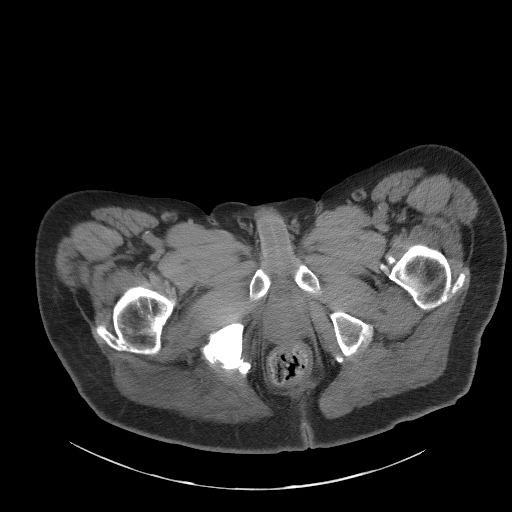
[im 20/88  soft-tissue]
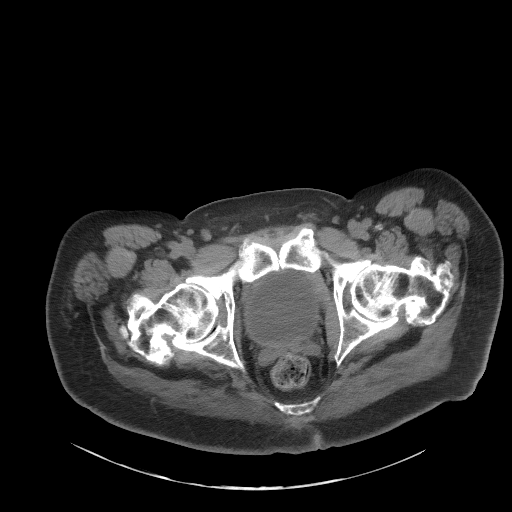
[im 26/88  soft-tissue]
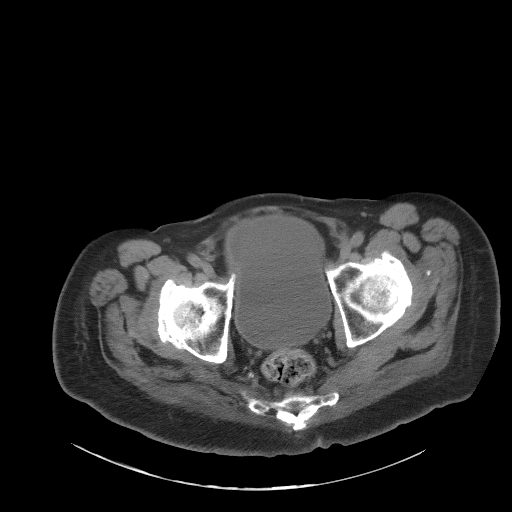
[im 33/88  soft-tissue]
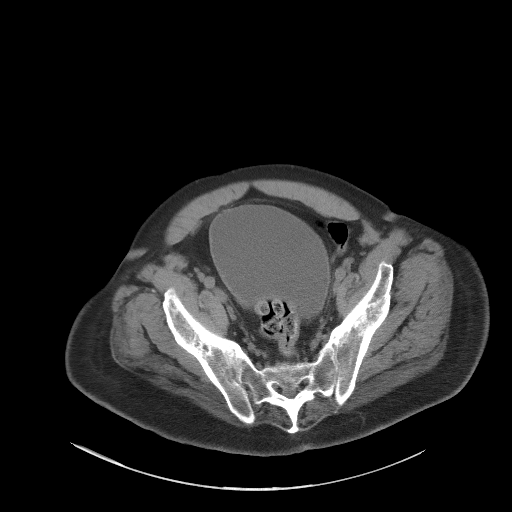
[im 39/88  soft-tissue]
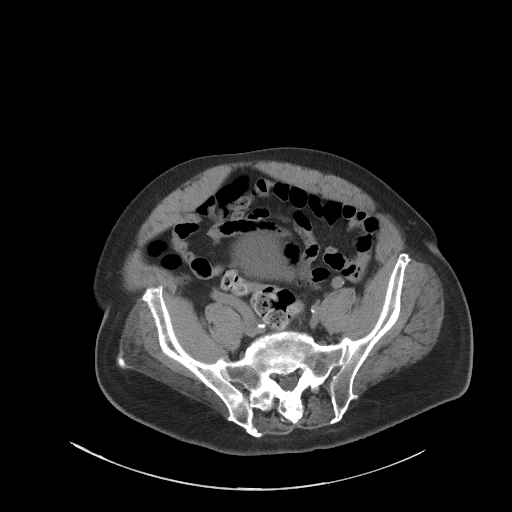
[im 46/88  soft-tissue]
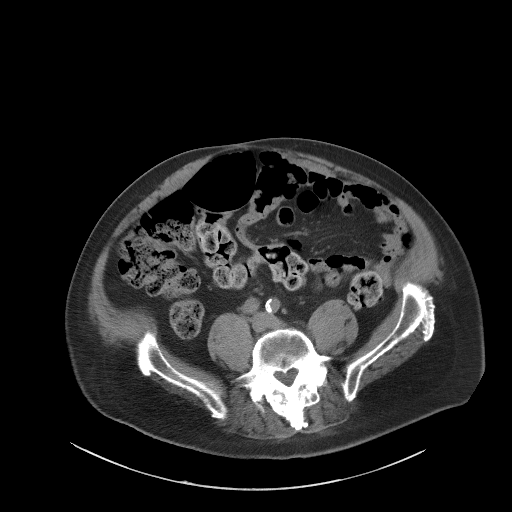
[im 52/88  soft-tissue]
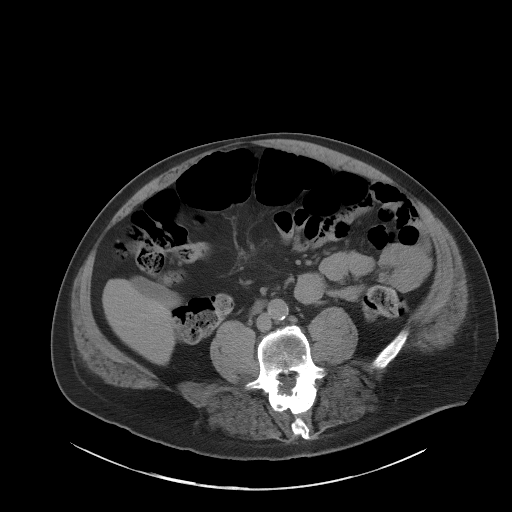
[im 59/88  soft-tissue]
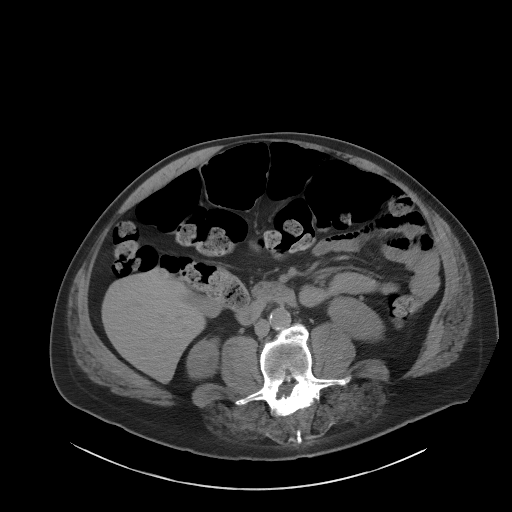
[im 59/88  bone]
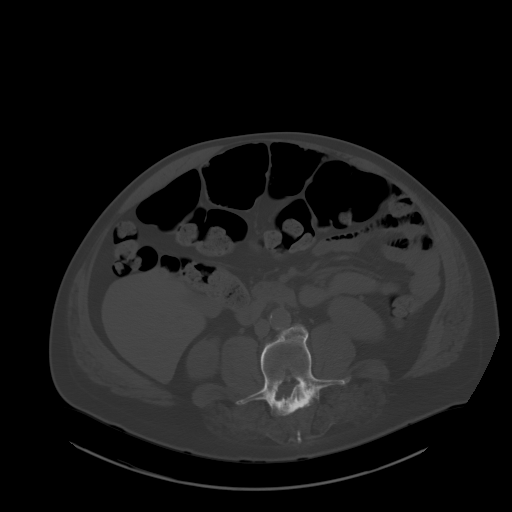
[im 65/88  soft-tissue]
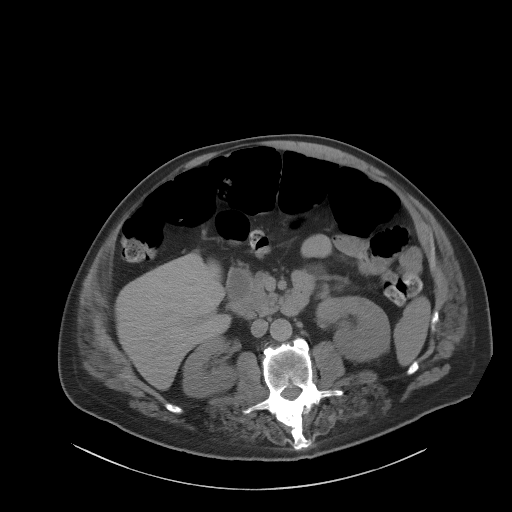
[im 71/88  soft-tissue]
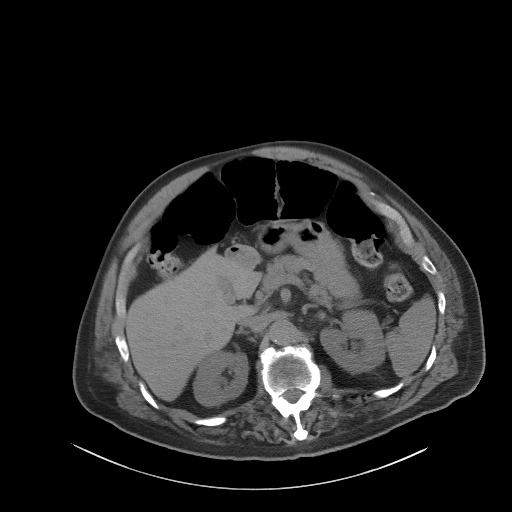
[im 78/88  soft-tissue]
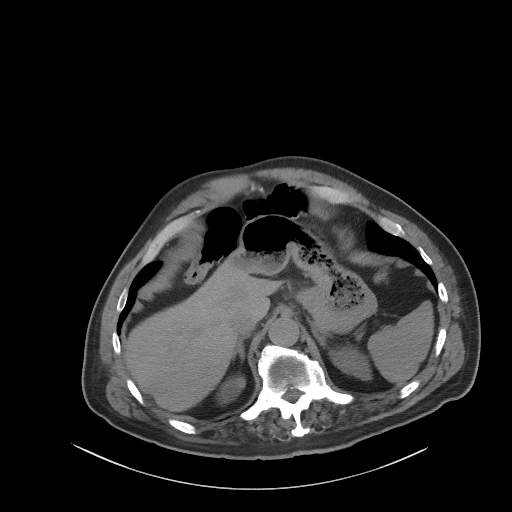
[im 84/88  soft-tissue]
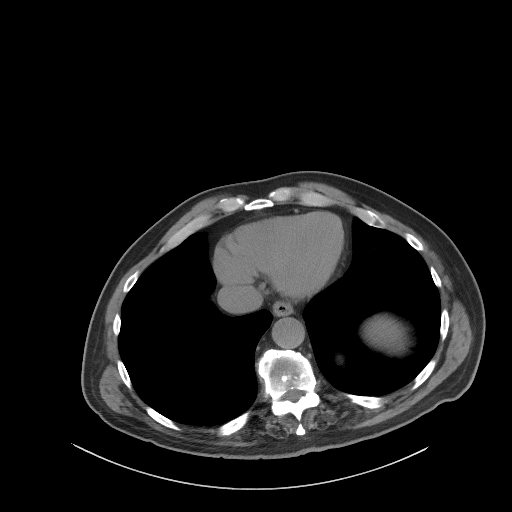

[Series 5: coronal st · coronal · 0.90mm/px · 3 of 104 slices shown]
[im 35/104  soft-tissue]
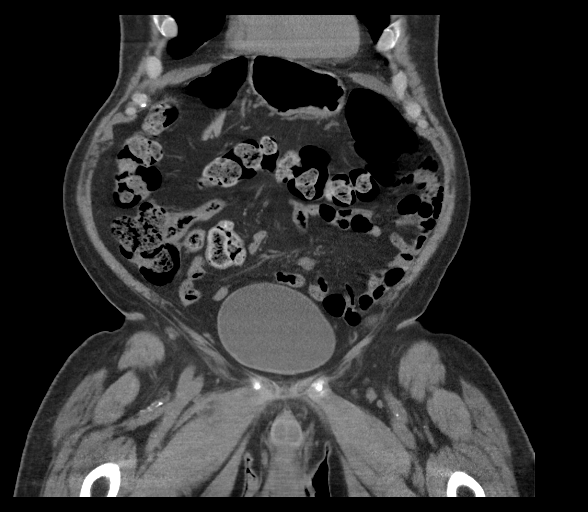
[im 46/104  soft-tissue]
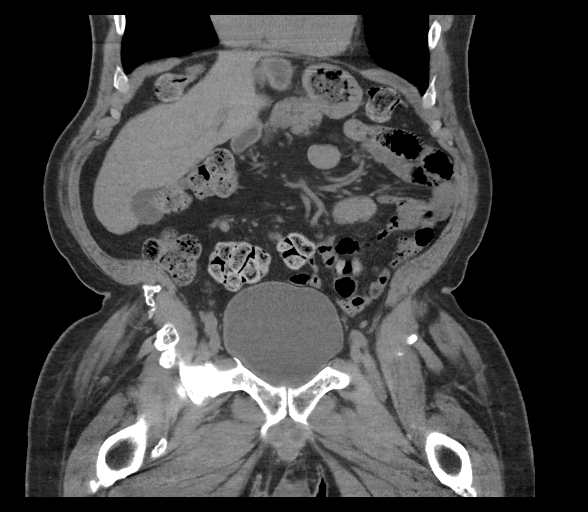
[im 58/104  soft-tissue]
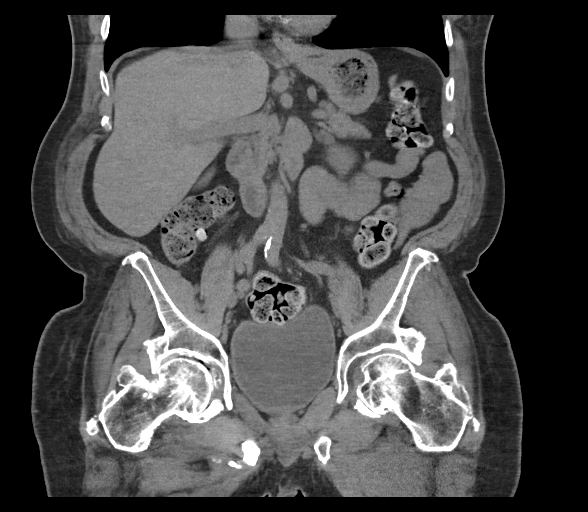

[16 of 46 positions shown; findings below may reference images not displayed]

FINDINGS: Please note that parenchymal abnormalities may be missed without
intravenous contrast.

Lower chest: No acute abnormality.

Hepatobiliary: The liver and gallbladder are unremarkable. There is
no evidence of biliary dilatation.

Pancreas: Unremarkable

Spleen: Unremarkable

Adrenals/Urinary Tract: Nonobstructing right renal calculi are
identified, the largest measuring 8 mm. There is no evidence of
hydronephrosis or obstructing urinary calculi. The adrenal glands
and bladder are unremarkable.

Stomach/Bowel: Stomach is within normal limits. Appendix appears
normal. No evidence of bowel wall thickening, distention, or
inflammatory changes.

Vascular/Lymphatic: Aortic atherosclerosis. No enlarged abdominal or
pelvic lymph nodes.

Reproductive: Prostate is unremarkable.

Other: No abdominal wall hernia or abnormality. No abdominopelvic
ascites.

Musculoskeletal: A right gluteal/decubitus ulcer is noted with
sclerosis of the right ischial tuberosity suspicious for chronic
osteomyelitis. No acute fracture identified. Degenerative changes
within the visualized spine identified.
IMPRESSION: Right gluteal/decubitus ulcer with sclerosis of the adjacent right
ischial tuberosity -suspicious for chronic osteomyelitis.

No evidence of acute abnormality within the abdomen or pelvis.

Nonobstructing right renal calculi.

Abdominal aortic atherosclerosis.

## 2022-06-22 ENCOUNTER — Encounter (HOSPITAL_COMMUNITY): Payer: Self-pay

## 2022-06-22 ENCOUNTER — Encounter (HOSPITAL_BASED_OUTPATIENT_CLINIC_OR_DEPARTMENT_OTHER): Payer: Self-pay | Admitting: *Deleted

## 2022-06-22 ENCOUNTER — Inpatient Hospital Stay (HOSPITAL_BASED_OUTPATIENT_CLINIC_OR_DEPARTMENT_OTHER)
Admission: EM | Admit: 2022-06-22 | Discharge: 2022-07-01 | DRG: 240 | Disposition: A | Discharge status: Home / Self Care | Payer: Medicare HMO | Attending: Internal Medicine | Admitting: Internal Medicine

## 2022-06-22 ENCOUNTER — Emergency Department (HOSPITAL_BASED_OUTPATIENT_CLINIC_OR_DEPARTMENT_OTHER): Payer: Medicare HMO

## 2022-06-22 ENCOUNTER — Other Ambulatory Visit: Payer: Self-pay

## 2022-06-22 DIAGNOSIS — I2510 Atherosclerotic heart disease of native coronary artery without angina pectoris: Secondary | ICD-10-CM | POA: Diagnosis present

## 2022-06-22 DIAGNOSIS — E1169 Type 2 diabetes mellitus with other specified complication: Secondary | ICD-10-CM | POA: Diagnosis present

## 2022-06-22 DIAGNOSIS — M869 Osteomyelitis, unspecified: Secondary | ICD-10-CM | POA: Diagnosis not present

## 2022-06-22 DIAGNOSIS — G8220 Paraplegia, unspecified: Secondary | ICD-10-CM | POA: Diagnosis present

## 2022-06-22 DIAGNOSIS — E876 Hypokalemia: Secondary | ICD-10-CM | POA: Diagnosis present

## 2022-06-22 DIAGNOSIS — I10 Essential (primary) hypertension: Secondary | ICD-10-CM

## 2022-06-22 DIAGNOSIS — E785 Hyperlipidemia, unspecified: Secondary | ICD-10-CM | POA: Diagnosis present

## 2022-06-22 DIAGNOSIS — L97909 Non-pressure chronic ulcer of unspecified part of unspecified lower leg with unspecified severity: Secondary | ICD-10-CM

## 2022-06-22 DIAGNOSIS — E1152 Type 2 diabetes mellitus with diabetic peripheral angiopathy with gangrene: Secondary | ICD-10-CM | POA: Diagnosis not present

## 2022-06-22 DIAGNOSIS — E440 Moderate protein-calorie malnutrition: Secondary | ICD-10-CM | POA: Diagnosis present

## 2022-06-22 DIAGNOSIS — Z955 Presence of coronary angioplasty implant and graft: Secondary | ICD-10-CM

## 2022-06-22 DIAGNOSIS — I252 Old myocardial infarction: Secondary | ICD-10-CM

## 2022-06-22 DIAGNOSIS — M86172 Other acute osteomyelitis, left ankle and foot: Secondary | ICD-10-CM | POA: Diagnosis present

## 2022-06-22 DIAGNOSIS — D638 Anemia in other chronic diseases classified elsewhere: Secondary | ICD-10-CM | POA: Diagnosis present

## 2022-06-22 DIAGNOSIS — I70261 Atherosclerosis of native arteries of extremities with gangrene, right leg: Secondary | ICD-10-CM | POA: Diagnosis present

## 2022-06-22 DIAGNOSIS — L89620 Pressure ulcer of left heel, unstageable: Secondary | ICD-10-CM | POA: Diagnosis present

## 2022-06-22 DIAGNOSIS — D509 Iron deficiency anemia, unspecified: Secondary | ICD-10-CM | POA: Diagnosis present

## 2022-06-22 DIAGNOSIS — E11621 Type 2 diabetes mellitus with foot ulcer: Secondary | ICD-10-CM | POA: Diagnosis present

## 2022-06-22 DIAGNOSIS — I110 Hypertensive heart disease with heart failure: Secondary | ICD-10-CM | POA: Diagnosis present

## 2022-06-22 DIAGNOSIS — D649 Anemia, unspecified: Secondary | ICD-10-CM

## 2022-06-22 DIAGNOSIS — I5022 Chronic systolic (congestive) heart failure: Secondary | ICD-10-CM | POA: Diagnosis present

## 2022-06-22 DIAGNOSIS — Z7984 Long term (current) use of oral hypoglycemic drugs: Secondary | ICD-10-CM

## 2022-06-22 DIAGNOSIS — M4800 Spinal stenosis, site unspecified: Secondary | ICD-10-CM | POA: Diagnosis present

## 2022-06-22 DIAGNOSIS — D7589 Other specified diseases of blood and blood-forming organs: Secondary | ICD-10-CM | POA: Diagnosis present

## 2022-06-22 DIAGNOSIS — I255 Ischemic cardiomyopathy: Secondary | ICD-10-CM | POA: Diagnosis present

## 2022-06-22 DIAGNOSIS — Z79899 Other long term (current) drug therapy: Secondary | ICD-10-CM

## 2022-06-22 DIAGNOSIS — M86179 Other acute osteomyelitis, unspecified ankle and foot: Secondary | ICD-10-CM

## 2022-06-22 DIAGNOSIS — Z6822 Body mass index (BMI) 22.0-22.9, adult: Secondary | ICD-10-CM

## 2022-06-22 DIAGNOSIS — M24562 Contracture, left knee: Secondary | ICD-10-CM | POA: Diagnosis present

## 2022-06-22 DIAGNOSIS — M24561 Contracture, right knee: Secondary | ICD-10-CM | POA: Diagnosis present

## 2022-06-22 DIAGNOSIS — E119 Type 2 diabetes mellitus without complications: Secondary | ICD-10-CM

## 2022-06-22 DIAGNOSIS — L97511 Non-pressure chronic ulcer of other part of right foot limited to breakdown of skin: Secondary | ICD-10-CM | POA: Diagnosis present

## 2022-06-22 DIAGNOSIS — L97521 Non-pressure chronic ulcer of other part of left foot limited to breakdown of skin: Secondary | ICD-10-CM | POA: Diagnosis present

## 2022-06-22 DIAGNOSIS — E11649 Type 2 diabetes mellitus with hypoglycemia without coma: Secondary | ICD-10-CM | POA: Diagnosis not present

## 2022-06-22 DIAGNOSIS — I480 Paroxysmal atrial fibrillation: Secondary | ICD-10-CM | POA: Diagnosis not present

## 2022-06-22 DIAGNOSIS — Z993 Dependence on wheelchair: Secondary | ICD-10-CM

## 2022-06-22 LAB — CBC WITH DIFFERENTIAL/PLATELET
Abs Immature Granulocytes: 0.03 10*3/uL (ref 0.00–0.07)
Basophils Absolute: 0.1 10*3/uL (ref 0.0–0.1)
Basophils Relative: 1 %
Eosinophils Absolute: 0.4 10*3/uL (ref 0.0–0.5)
Eosinophils Relative: 5 %
HCT: 32.3 % — ABNORMAL LOW (ref 39.0–52.0)
Hemoglobin: 9.9 g/dL — ABNORMAL LOW (ref 13.0–17.0)
Immature Granulocytes: 0 %
Lymphocytes Relative: 13 %
Lymphs Abs: 1.2 10*3/uL (ref 0.7–4.0)
MCH: 22.6 pg — ABNORMAL LOW (ref 26.0–34.0)
MCHC: 30.7 g/dL (ref 30.0–36.0)
MCV: 73.7 fL — ABNORMAL LOW (ref 80.0–100.0)
Monocytes Absolute: 0.5 10*3/uL (ref 0.1–1.0)
Monocytes Relative: 6 %
Neutro Abs: 6.7 10*3/uL (ref 1.7–7.7)
Neutrophils Relative %: 75 %
Platelets: 391 10*3/uL (ref 150–400)
RBC: 4.38 MIL/uL (ref 4.22–5.81)
RDW: 17.2 % — ABNORMAL HIGH (ref 11.5–15.5)
WBC: 8.9 10*3/uL (ref 4.0–10.5)
nRBC: 0 % (ref 0.0–0.2)

## 2022-06-22 LAB — COMPREHENSIVE METABOLIC PANEL
ALT: 14 U/L (ref 0–44)
AST: 18 U/L (ref 15–41)
Albumin: 3.1 g/dL — ABNORMAL LOW (ref 3.5–5.0)
Alkaline Phosphatase: 84 U/L (ref 38–126)
Anion gap: 11 (ref 5–15)
BUN: 17 mg/dL (ref 8–23)
CO2: 28 mmol/L (ref 22–32)
Calcium: 8.8 mg/dL — ABNORMAL LOW (ref 8.9–10.3)
Chloride: 97 mmol/L — ABNORMAL LOW (ref 98–111)
Creatinine, Ser: 0.99 mg/dL (ref 0.61–1.24)
GFR, Estimated: >60 mL/min (ref >60)
Glucose, Bld: 115 mg/dL — ABNORMAL HIGH (ref 70–99)
Potassium: 3.3 mmol/L — ABNORMAL LOW (ref 3.5–5.1)
Sodium: 136 mmol/L (ref 135–145)
Total Bilirubin: 0.5 mg/dL (ref 0.3–1.2)
Total Protein: 8.2 g/dL — ABNORMAL HIGH (ref 6.5–8.1)

## 2022-06-22 LAB — PROTIME-INR
INR: 1.1 (ref 0.8–1.2)
Prothrombin Time: 13.8 seconds (ref 11.4–15.2)

## 2022-06-22 LAB — LACTIC ACID, PLASMA: Lactic Acid, Venous: 2.3 mmol/L (ref 0.5–1.9)

## 2022-06-22 LAB — SEDIMENTATION RATE: Sed Rate: 79 mm/hr — ABNORMAL HIGH (ref 0–16)

## 2022-06-22 MED ORDER — VANCOMYCIN HCL IN DEXTROSE 1-5 GM/200ML-% IV SOLN
1000.0000 mg | Freq: Once | INTRAVENOUS | Status: AC
  Administered 2022-06-23: 1000 mg via INTRAVENOUS
  Filled 2022-06-22: qty 200

## 2022-06-22 MED ORDER — VANCOMYCIN HCL IN DEXTROSE 1-5 GM/200ML-% IV SOLN
1000.0000 mg | Freq: Once | INTRAVENOUS | Status: AC
  Administered 2022-06-22: 1000 mg via INTRAVENOUS
  Filled 2022-06-22: qty 200

## 2022-06-22 MED ORDER — OXYCODONE-ACETAMINOPHEN 5-325 MG PO TABS
1.0000 | ORAL_TABLET | Freq: Once | ORAL | Status: AC
  Administered 2022-06-22: 1 via ORAL
  Filled 2022-06-22: qty 1

## 2022-06-22 MED ORDER — POTASSIUM CHLORIDE 10 MEQ/100ML IV SOLN
10.0000 meq | Freq: Once | INTRAVENOUS | Status: AC
  Administered 2022-06-22: 10 meq via INTRAVENOUS
  Filled 2022-06-22: qty 100

## 2022-06-22 MED ORDER — LACTATED RINGERS IV BOLUS
1000.0000 mL | Freq: Once | INTRAVENOUS | Status: AC
  Administered 2022-06-22: 1000 mL via INTRAVENOUS

## 2022-06-22 MED ORDER — VANCOMYCIN HCL 2000 MG/400ML IV SOLN
2000.0000 mg | Freq: Once | INTRAVENOUS | Status: DC
  Filled 2022-06-22: qty 400

## 2022-06-22 MED ORDER — SODIUM CHLORIDE 0.9 % IV SOLN
2.0000 g | Freq: Once | INTRAVENOUS | Status: AC
  Administered 2022-06-22: 2 g via INTRAVENOUS
  Filled 2022-06-22: qty 20

## 2022-06-22 NOTE — ED Notes (Signed)
ED Provider at bedside. 

## 2022-06-22 NOTE — ED Triage Notes (Signed)
Pt is here by ems for evaluation of wounds which ems states are bedsores.  Pt reports wounds on buttock as well as right leg.  Pt appears diaphoretic in triage.

## 2022-06-22 NOTE — ED Provider Notes (Signed)
[facility] EMERGENCY DEPARTMENT Provider Note   CSN: [id number] Arrival date & time: 06/22/22  1846     History  Chief Complaint  Patient presents with   Wound Check    Jeffrey Fields is a 65 y.o. male.  Patient is a 66 year old male with a past medical history of hypertension, diabetes, paraplegia from spinal stenosis presenting to the emergency department with bilateral foot wounds.  He states that he has a home health aide that helps take care of his wounds at home.  He states that over the last 2 days his aide has reported concerns that the wounds have gotten bigger and had increased drainage.  He states that he has not noticed any spreading redness up his legs.  He denies any fevers, nausea, vomiting or diarrhea.  He states that he also has sacral wounds that were told have been looking well.  The history is provided by the patient.  Wound Check       Home Medications Prior to Admission medications   Medication Sig Start Date End Date Taking? Authorizing Provider  amLODipine (NORVASC) 10 MG tablet Take 10 mg by mouth daily.    [provider]  cephALEXin (KEFLEX) 500 MG capsule Take 1 capsule (500 mg total) by mouth 3 (three) times daily. 11/16/16   Quintella Reichert, MD  furosemide (LASIX) 20 MG tablet Take 20 mg by mouth daily. 06/09/14   [provider]  HYDROcodone-acetaminophen (NORCO) 7.5-325 MG tablet Take 1 tablet by mouth every 6 (six) hours as needed for moderate pain. 07/16/16   Veryl Speak, MD  metFORMIN (GLUCOPHAGE) 500 MG tablet Take 500 mg by mouth 2 (two) times daily. 08/15/14   [provider]  metoprolol (LOPRESSOR) 50 MG tablet Take 50 mg by mouth 2 (two) times daily.    [provider]  oxyCODONE-acetaminophen (PERCOCET) 7.5-325 MG per tablet Take 1 tablet by mouth every 6 (six) hours as needed. 08/20/14   [provider]  potassium chloride (K-DUR) 10 MEQ tablet Take 10 mEq by mouth 2 (two) times daily.     [provider]  rosuvastatin (CRESTOR) 10 MG tablet Take 10 mg by mouth daily.    [provider]  valsartan (DIOVAN) 80 MG tablet Take 80 mg by mouth daily.     [provider]      Allergies    Patient has no known allergies.    Review of Systems   Review of Systems  Physical Exam Updated Vital Signs BP (!) 147/88   Pulse 93   Temp 98.1 F (36.7 C) (Oral)   Resp 14   SpO2 100%  Physical Exam Vitals and nursing note reviewed.  Constitutional:      General: He is not in acute distress.    Appearance: Normal appearance.  HENT:     Head: Normocephalic and atraumatic.     Nose: Nose normal.     Mouth/Throat:     Mouth: Mucous membranes are moist.     Pharynx: Oropharynx is clear.  Eyes:     Extraocular Movements: Extraocular movements intact.     Conjunctiva/sclera: Conjunctivae normal.  Cardiovascular:     Rate and Rhythm: Normal rate and regular rhythm.     Pulses: Normal pulses.     Heart sounds: Normal heart sounds.  Pulmonary:     Effort: Pulmonary effort is normal.     Breath sounds: Normal breath sounds.  Abdominal:     General: Abdomen is flat.  Palpations: Abdomen is soft.     Tenderness: There is no abdominal tenderness.  Musculoskeletal:        General: Normal range of motion.     Cervical back: Normal range of motion and neck supple.     Right lower leg: No edema.     Left lower leg: No edema.  Skin:    General: Skin is warm.     Capillary Refill: Capillary refill takes less than 2 seconds.     Comments: See photos below Approximately 1 cm ulceration to left lateral foot with no active drainage, no surrounding erythema or warmth Approximately 2 cm ulceration to right lateral foot with small amount of purulent drainage, no significant erythema or warmth Approximately 4 cm ulceration to right lateral ankle with purulent drainage, no surrounding erythema or warmth  Neurological:     Mental Status: He is alert and oriented  to person, place, and time. Mental status is at baseline.  Psychiatric:        Mood and Affect: Mood normal.        Behavior: Behavior normal.            ED Results / Procedures / Treatments   Labs (all labs ordered are listed, but only abnormal results are displayed) Labs Reviewed  COMPREHENSIVE METABOLIC PANEL - Abnormal; Notable for the following components:      Result Value   Potassium 3.3 (*)    Chloride 97 (*)    Glucose, Bld 115 (*)    Calcium 8.8 (*)    Total Protein 8.2 (*)    Albumin 3.1 (*)    All other components within normal limits  LACTIC ACID, PLASMA - Abnormal; Notable for the following components:   Lactic Acid, Venous 2.3 (*)    All other components within normal limits  CBC WITH DIFFERENTIAL/PLATELET - Abnormal; Notable for the following components:   Hemoglobin 9.9 (*)    HCT 32.3 (*)    MCV 73.7 (*)    MCH 22.6 (*)    RDW 17.2 (*)    All other components within normal limits  SEDIMENTATION RATE - Abnormal; Notable for the following components:   Sed Rate 79 (*)    All other components within normal limits  CULTURE, BLOOD (ROUTINE X 2)  CULTURE, BLOOD (ROUTINE X 2)  PROTIME-INR  LACTIC ACID, PLASMA  URINALYSIS, ROUTINE W REFLEX MICROSCOPIC  C-REACTIVE PROTEIN    EKG None  Radiology DG Foot 2 Views Right  Result Date: 06/22/2022 CLINICAL DATA:  Foot wound EXAM: RIGHT FOOT - 2 VIEW COMPARISON:  None Available. FINDINGS: Bones appear demineralized. Large plantar calcaneal spur. Tarsal degenerative change. Moderate arthritis at the first IP and MTP joints. Partial osseous bridging across the second MTP joint. Ulcer lateral aspect of the foot. Erosion at the base of the fifth proximal phalanx. Erosion and osseous destructive change involving the distal shaft and head of the fifth metacarpal. IMPRESSION: 1. Positive for osteomyelitis involving the base of the fifth proximal phalanx and the mid to distal fifth metatarsal with overlying wound  Electronically Signed   By: Jasmine Pang M.D.   On: 06/22/2022 21:25   DG Foot Complete Left  Result Date: 06/22/2022 CLINICAL DATA:  Foot wounds EXAM: LEFT FOOT - COMPLETE 3+ VIEW COMPARISON:  None Available. FINDINGS: Bones appear demineralized. Degenerative changes at the TMT joints. Ulcer lateral aspect of the foot at the level of fifth MTP joint. Erosive change at the base of the fifth proximal phalanx  and head of the fifth metatarsal, consistent with osteomyelitis. Possible pathologic fracture deformity at the base of the fifth proximal phalanx. Moderate degenerative change at the first IP and MTP joints. Small gas in the soft tissues could relate to ulcer or necrotizing infection. IMPRESSION: 1. Soft tissue ulcer lateral aspect of the foot at the level of the fifth MTP joint with evidence for osteomyelitis involving the base of the fifth proximal phalanx and the head of the fifth metatarsal. Small amount of soft tissue gas could relate to the patient's wound or be due to necrotic infection Electronically Signed   By: Donavan Foil M.D.   On: 06/22/2022 21:23   DG Chest 2 View  Result Date: 06/22/2022 CLINICAL DATA:  Wounds possible sepsis EXAM: CHEST - 2 VIEW COMPARISON:  Chest x-ray 12/01/2021, chest CT 11/26/2021, abdomen radiograph 12/01/2021 FINDINGS: No acute airspace disease or effusion. Stable cardiomediastinal silhouette, right hilar convex opacity felt related to ectatic ascending aorta on chest CT. No pneumothorax. Probable prominent air distended bowel loops in the upper abdomen beneath the diaphragms IMPRESSION: 1. No radiographic evidence for acute cardiopulmonary abnormality 2. Lucency beneath the diaphragms probably due to marked air distension of bowel given appearance on lateral view but recommend dedicated abdominal radiograph and/or CT for further assessment. Electronically Signed   By: Donavan Foil M.D.   On: 06/22/2022 19:53    Procedures Procedures    Medications Ordered  in ED Medications  potassium chloride 10 mEq in 100 mL IVPB (10 mEq Intravenous New Bag/Given 06/22/22 2225)  vancomycin (VANCOCIN) IVPB 1000 mg/200 mL premix (has no administration in time range)    And  vancomycin (VANCOCIN) IVPB 1000 mg/200 mL premix (has no administration in time range)  oxyCODONE-acetaminophen (PERCOCET/ROXICET) 5-325 MG per tablet 1 tablet (has no administration in time range)  cefTRIAXone (ROCEPHIN) 2 g in sodium chloride 0.9 % 100 mL IVPB (2 g Intravenous New Bag/Given 06/22/22 2228)  lactated ringers bolus 1,000 mL (1,000 mLs Intravenous New Bag/Given 06/22/22 2217)    ED Course/ Medical Decision Making/ A&P Clinical Course as of 06/22/22 2307  Fri Jun 22, 2022  2200 Since x-ray is concerning for osteomyelitis.  Labs show an elevated lactate, normal WBC.  He was started on broad-spectrum antibiotics and fluids.  I spoke with orthopedics who recommended admission to hospitalist for IV antibiotics and orthopedic evaluation tomorrow. [VK]    Clinical Course User Index [VK] Ottie Glazier, DO                           Medical Decision Making This patient presents to the ED with chief complaint(s) of foot wounds with pertinent past medical history of paraplegia, diabetes, hypertension which further complicates the presenting complaint. The complaint involves an extensive differential diagnosis and also carries with it a high risk of complications and morbidity.    The differential diagnosis includes cellulitis, wound infection, osteomyelitis, sepsis  Additional history obtained: Additional history obtained from family Records reviewed N/A  ED Course and Reassessment: Upon patient's arrival to the emergency department he is well-appearing.  He does have purulent wounds to his right foot and a nonpurulent wound to his left.  X-rays will be performed to evaluate for osteomyelitis and labs will be performed to evaluate for sepsis.  Independent labs interpretation:   The following labs were independently interpreted: Elevated lactate, elevated sed rate  Independent visualization of imaging: - I independently visualized the following imaging with scope of  interpretation limited to determining acute life threatening conditions related to emergency care: Bilateral foot x-rays, which revealed osteomyelitis of fifth metatarsal and proximal fifth phalanx on bilateral feet  Consultation: - Consulted or discussed management/test interpretation w/ external professional: Orthopedics, hospitalist  Consideration for admission or further workup: Patient requires admission for IV antibiotics and orthopedics evaluation Social Determinants of health: N/A    Amount and/or Complexity of Data Reviewed Labs: ordered. Radiology: ordered.  Risk Prescription drug management. Decision regarding hospitalization.          Final Clinical Impression(s) / ED Diagnoses Final diagnoses:  Other acute osteomyelitis of foot, unspecified laterality [facility])    Rx / DC Orders ED Discharge Orders     None         Phoebe Sharps, DO 06/22/22 2307

## 2022-06-23 ENCOUNTER — Inpatient Hospital Stay (HOSPITAL_COMMUNITY): Payer: Medicare HMO

## 2022-06-23 ENCOUNTER — Encounter (HOSPITAL_COMMUNITY): Payer: Self-pay | Admitting: Internal Medicine

## 2022-06-23 DIAGNOSIS — I70261 Atherosclerosis of native arteries of extremities with gangrene, right leg: Secondary | ICD-10-CM | POA: Diagnosis present

## 2022-06-23 DIAGNOSIS — I252 Old myocardial infarction: Secondary | ICD-10-CM | POA: Diagnosis not present

## 2022-06-23 DIAGNOSIS — E11649 Type 2 diabetes mellitus with hypoglycemia without coma: Secondary | ICD-10-CM | POA: Diagnosis not present

## 2022-06-23 DIAGNOSIS — M869 Osteomyelitis, unspecified: Secondary | ICD-10-CM | POA: Diagnosis present

## 2022-06-23 DIAGNOSIS — L97521 Non-pressure chronic ulcer of other part of left foot limited to breakdown of skin: Secondary | ICD-10-CM | POA: Diagnosis present

## 2022-06-23 DIAGNOSIS — I2510 Atherosclerotic heart disease of native coronary artery without angina pectoris: Secondary | ICD-10-CM | POA: Diagnosis not present

## 2022-06-23 DIAGNOSIS — E1169 Type 2 diabetes mellitus with other specified complication: Secondary | ICD-10-CM | POA: Diagnosis present

## 2022-06-23 DIAGNOSIS — L0390 Cellulitis, unspecified: Secondary | ICD-10-CM | POA: Diagnosis not present

## 2022-06-23 DIAGNOSIS — Z993 Dependence on wheelchair: Secondary | ICD-10-CM | POA: Diagnosis not present

## 2022-06-23 DIAGNOSIS — I255 Ischemic cardiomyopathy: Secondary | ICD-10-CM | POA: Diagnosis present

## 2022-06-23 DIAGNOSIS — L97909 Non-pressure chronic ulcer of unspecified part of unspecified lower leg with unspecified severity: Secondary | ICD-10-CM | POA: Diagnosis not present

## 2022-06-23 DIAGNOSIS — I110 Hypertensive heart disease with heart failure: Secondary | ICD-10-CM | POA: Diagnosis present

## 2022-06-23 DIAGNOSIS — I96 Gangrene, not elsewhere classified: Secondary | ICD-10-CM | POA: Diagnosis not present

## 2022-06-23 DIAGNOSIS — E876 Hypokalemia: Secondary | ICD-10-CM | POA: Diagnosis present

## 2022-06-23 DIAGNOSIS — L97511 Non-pressure chronic ulcer of other part of right foot limited to breakdown of skin: Secondary | ICD-10-CM | POA: Diagnosis present

## 2022-06-23 DIAGNOSIS — E785 Hyperlipidemia, unspecified: Secondary | ICD-10-CM | POA: Diagnosis present

## 2022-06-23 DIAGNOSIS — M24562 Contracture, left knee: Secondary | ICD-10-CM | POA: Diagnosis present

## 2022-06-23 DIAGNOSIS — E119 Type 2 diabetes mellitus without complications: Secondary | ICD-10-CM

## 2022-06-23 DIAGNOSIS — M86172 Other acute osteomyelitis, left ankle and foot: Secondary | ICD-10-CM | POA: Diagnosis present

## 2022-06-23 DIAGNOSIS — I10 Essential (primary) hypertension: Secondary | ICD-10-CM

## 2022-06-23 DIAGNOSIS — E11622 Type 2 diabetes mellitus with other skin ulcer: Secondary | ICD-10-CM | POA: Diagnosis not present

## 2022-06-23 DIAGNOSIS — Z0189 Encounter for other specified special examinations: Secondary | ICD-10-CM | POA: Diagnosis not present

## 2022-06-23 DIAGNOSIS — I509 Heart failure, unspecified: Secondary | ICD-10-CM | POA: Diagnosis not present

## 2022-06-23 DIAGNOSIS — D638 Anemia in other chronic diseases classified elsewhere: Secondary | ICD-10-CM | POA: Diagnosis present

## 2022-06-23 DIAGNOSIS — I5022 Chronic systolic (congestive) heart failure: Secondary | ICD-10-CM | POA: Diagnosis present

## 2022-06-23 DIAGNOSIS — E1152 Type 2 diabetes mellitus with diabetic peripheral angiopathy with gangrene: Secondary | ICD-10-CM | POA: Diagnosis present

## 2022-06-23 DIAGNOSIS — M86271 Subacute osteomyelitis, right ankle and foot: Secondary | ICD-10-CM | POA: Diagnosis not present

## 2022-06-23 DIAGNOSIS — D7589 Other specified diseases of blood and blood-forming organs: Secondary | ICD-10-CM | POA: Diagnosis present

## 2022-06-23 DIAGNOSIS — L89620 Pressure ulcer of left heel, unstageable: Secondary | ICD-10-CM | POA: Diagnosis present

## 2022-06-23 DIAGNOSIS — D649 Anemia, unspecified: Secondary | ICD-10-CM | POA: Diagnosis not present

## 2022-06-23 DIAGNOSIS — G8220 Paraplegia, unspecified: Secondary | ICD-10-CM | POA: Diagnosis present

## 2022-06-23 DIAGNOSIS — E11621 Type 2 diabetes mellitus with foot ulcer: Secondary | ICD-10-CM | POA: Diagnosis present

## 2022-06-23 DIAGNOSIS — M24561 Contracture, right knee: Secondary | ICD-10-CM | POA: Diagnosis present

## 2022-06-23 DIAGNOSIS — M4800 Spinal stenosis, site unspecified: Secondary | ICD-10-CM | POA: Diagnosis present

## 2022-06-23 DIAGNOSIS — M86179 Other acute osteomyelitis, unspecified ankle and foot: Secondary | ICD-10-CM | POA: Diagnosis not present

## 2022-06-23 DIAGNOSIS — I739 Peripheral vascular disease, unspecified: Secondary | ICD-10-CM | POA: Diagnosis not present

## 2022-06-23 DIAGNOSIS — I480 Paroxysmal atrial fibrillation: Secondary | ICD-10-CM | POA: Diagnosis not present

## 2022-06-23 DIAGNOSIS — E440 Moderate protein-calorie malnutrition: Secondary | ICD-10-CM | POA: Diagnosis present

## 2022-06-23 LAB — HIV ANTIBODY (ROUTINE TESTING W REFLEX): HIV Screen 4th Generation wRfx: NONREACTIVE

## 2022-06-23 LAB — CBC
HCT: 28 % — ABNORMAL LOW (ref 39.0–52.0)
Hemoglobin: 8.6 g/dL — ABNORMAL LOW (ref 13.0–17.0)
MCH: 22.6 pg — ABNORMAL LOW (ref 26.0–34.0)
MCHC: 30.7 g/dL (ref 30.0–36.0)
MCV: 73.7 fL — ABNORMAL LOW (ref 80.0–100.0)
Platelets: 336 10*3/uL (ref 150–400)
RBC: 3.8 MIL/uL — ABNORMAL LOW (ref 4.22–5.81)
RDW: 17.1 % — ABNORMAL HIGH (ref 11.5–15.5)
WBC: 9 10*3/uL (ref 4.0–10.5)
nRBC: 0 % (ref 0.0–0.2)

## 2022-06-23 LAB — BASIC METABOLIC PANEL
Anion gap: 5 (ref 5–15)
BUN: 10 mg/dL (ref 8–23)
CO2: 29 mmol/L (ref 22–32)
Calcium: 8.2 mg/dL — ABNORMAL LOW (ref 8.9–10.3)
Chloride: 104 mmol/L (ref 98–111)
Creatinine, Ser: 0.82 mg/dL (ref 0.61–1.24)
GFR, Estimated: >60 mL/min (ref >60)
Glucose, Bld: 85 mg/dL (ref 70–99)
Potassium: 3 mmol/L — ABNORMAL LOW (ref 3.5–5.1)
Sodium: 138 mmol/L (ref 135–145)

## 2022-06-23 LAB — GLUCOSE, CAPILLARY
Glucose-Capillary: 99 mg/dL (ref 70–99)
Glucose-Capillary: 96 mg/dL (ref 70–99)
Glucose-Capillary: 104 mg/dL — ABNORMAL HIGH (ref 70–99)
Glucose-Capillary: 87 mg/dL (ref 70–99)

## 2022-06-23 LAB — HEMOGLOBIN A1C
Hgb A1c MFr Bld: 4.9 % (ref 4.8–5.6)
Mean Plasma Glucose: 93.93 mg/dL

## 2022-06-23 LAB — LACTIC ACID, PLASMA: Lactic Acid, Venous: 2 mmol/L (ref 0.5–1.9)

## 2022-06-23 LAB — C-REACTIVE PROTEIN: CRP: 10 mg/dL — ABNORMAL HIGH (ref <1.0)

## 2022-06-23 MED ORDER — AMLODIPINE BESYLATE 10 MG PO TABS
10.0000 mg | ORAL_TABLET | Freq: Every day | ORAL | Status: DC
  Administered 2022-06-23 – 2022-07-01 (×8): 10 mg via ORAL
  Filled 2022-06-23 (×9): qty 1

## 2022-06-23 MED ORDER — ACETAMINOPHEN 325 MG PO TABS
650.0000 mg | ORAL_TABLET | Freq: Four times a day (QID) | ORAL | Status: DC | PRN
  Filled 2022-06-23: qty 2

## 2022-06-23 MED ORDER — OXYCODONE-ACETAMINOPHEN 5-325 MG PO TABS
1.0000 | ORAL_TABLET | Freq: Four times a day (QID) | ORAL | Status: DC | PRN
  Administered 2022-06-23: 2 via ORAL
  Administered 2022-06-23 – 2022-06-24 (×4): 1 via ORAL
  Administered 2022-06-24: 2 via ORAL
  Administered 2022-06-24: 1 via ORAL
  Administered 2022-06-25 – 2022-06-29 (×9): 2 via ORAL
  Filled 2022-06-23 (×6): qty 2
  Filled 2022-06-23 (×2): qty 1
  Filled 2022-06-23 (×3): qty 2
  Filled 2022-06-23: qty 1
  Filled 2022-06-23: qty 2
  Filled 2022-06-23 (×2): qty 1
  Filled 2022-06-23: qty 2
  Filled 2022-06-23: qty 1

## 2022-06-23 MED ORDER — INSULIN ASPART 100 UNIT/ML IJ SOLN
0.0000 [IU] | INTRAMUSCULAR | Status: DC
  Administered 2022-06-25: 1 [IU] via SUBCUTANEOUS

## 2022-06-23 MED ORDER — COLLAGENASE 250 UNIT/GM EX OINT
TOPICAL_OINTMENT | Freq: Every day | CUTANEOUS | Status: DC
  Filled 2022-06-23 (×2): qty 30

## 2022-06-23 MED ORDER — METOPROLOL TARTRATE 50 MG PO TABS
50.0000 mg | ORAL_TABLET | Freq: Two times a day (BID) | ORAL | Status: DC
  Administered 2022-06-23 – 2022-07-01 (×17): 50 mg via ORAL
  Filled 2022-06-23 (×17): qty 1

## 2022-06-23 MED ORDER — SODIUM CHLORIDE 0.9 % IV SOLN
2.0000 g | Freq: Three times a day (TID) | INTRAVENOUS | Status: DC
  Administered 2022-06-23 – 2022-06-25 (×8): 2 g via INTRAVENOUS
  Filled 2022-06-23 (×9): qty 12.5

## 2022-06-23 MED ORDER — LISINOPRIL 10 MG PO TABS
10.0000 mg | ORAL_TABLET | Freq: Two times a day (BID) | ORAL | Status: DC
  Administered 2022-06-23 – 2022-07-01 (×16): 10 mg via ORAL
  Filled 2022-06-23 (×17): qty 1

## 2022-06-23 MED ORDER — MEDIHONEY WOUND/BURN DRESSING EX PSTE
1.0000 | PASTE | Freq: Every day | CUTANEOUS | Status: DC
  Administered 2022-06-24 – 2022-07-01 (×7): 1 via TOPICAL
  Filled 2022-06-23: qty 44

## 2022-06-23 MED ORDER — VANCOMYCIN HCL 750 MG/150ML IV SOLN
750.0000 mg | Freq: Two times a day (BID) | INTRAVENOUS | Status: DC
  Administered 2022-06-23 – 2022-06-28 (×9): 750 mg via INTRAVENOUS
  Filled 2022-06-23 (×10): qty 150

## 2022-06-23 MED ORDER — ENOXAPARIN SODIUM 40 MG/0.4ML IJ SOSY
40.0000 mg | PREFILLED_SYRINGE | Freq: Every day | INTRAMUSCULAR | Status: DC
  Administered 2022-06-23 – 2022-07-01 (×8): 40 mg via SUBCUTANEOUS
  Filled 2022-06-23 (×9): qty 0.4

## 2022-06-23 MED ORDER — POTASSIUM CHLORIDE CRYS ER 20 MEQ PO TBCR
40.0000 meq | EXTENDED_RELEASE_TABLET | Freq: Two times a day (BID) | ORAL | Status: AC
  Administered 2022-06-23 (×2): 40 meq via ORAL
  Filled 2022-06-23 (×2): qty 2

## 2022-06-23 MED ORDER — ROSUVASTATIN CALCIUM 5 MG PO TABS
10.0000 mg | ORAL_TABLET | ORAL | Status: DC
  Administered 2022-06-25 – 2022-06-28 (×2): 10 mg via ORAL
  Filled 2022-06-23 (×3): qty 2

## 2022-06-23 MED ORDER — ACETAMINOPHEN 650 MG RE SUPP: 650.0000 mg | Freq: Four times a day (QID) | RECTAL | Status: DC | PRN

## 2022-06-23 MED ORDER — HYDRALAZINE HCL 20 MG/ML IJ SOLN: 5.0000 mg | Freq: Four times a day (QID) | INTRAMUSCULAR | Status: DC | PRN

## 2022-06-23 NOTE — H&P (Signed)
History and Physical    Jeffrey Fields FYB:[id number] DOB: 30-Jan-1956 DOA: 06/22/2022  PCP: [name], DO  Patient coming from: [facility] ED  Chief Complaint: Bilateral foot wounds  HPI: Jeffrey Fields is a 66 y.o. male with medical history significant of type 2 diabetes, hypertension, hyperlipidemia, paraplegia secondary to spinal stenosis presented to the ED with complaint of bilateral lower extremity/foot wounds.  Vital signs stable on arrival to the ED.  Work-up showing no leukocytosis, hemoglobin 9.9 (stable since labs done 6 months ago), potassium 3.3, lactic acid 2.3> 2.0, ESR 79, CRP 10.0, blood cultures drawn.  X-ray of left foot showing osteomyelitis involving the base of the fifth proximal phalanx and the head of the fifth metatarsal.  Also showing a small amount of soft tissue gas which could relate to the patient's wound or be due to necrotic infection.  X-ray of right foot showing osteomyelitis involving the base of the fifth proximal phalanx in the mid to distal fifth metatarsal.  X-ray of right tibia/fibula negative for osteomyelitis. Patient was given Percocet, ceftriaxone, vancomycin, 1 L LR bolus, and potassium supplement in the ED. ED physician discussed the case with Dr. Doreatha Martin, orthopedics will consult.  Patient states he started noticing wounds on both of his lower legs/feet about 3 weeks ago.  He is no longer able to walk due to history of spinal stenosis and uses a wheelchair.  He has a home health aide who has been helping him clean these wounds but he has not seen a doctor on an outpatient basis.  Reporting pain in both of his lower legs.  No other complaints.  Denies fevers, chills, cough, shortness of breath, chest pain, nausea, vomiting, abdominal pain, diarrhea.  Review of Systems:  Review of Systems  All other systems reviewed and are negative.   Past Medical History:  Diagnosis Date   Diabetes mellitus    Hypertension    Paralysis ([place])    Spinal stenosis      Past Surgical History:  Procedure Laterality Date   BACK SURGERY       reports that he has never smoked. He has never used smokeless tobacco. He reports that he does not drink alcohol and does not use drugs.  No Known Allergies  History reviewed. No pertinent family history.  Prior to Admission medications   Medication Sig Start Date End Date Taking? Authorizing Provider  amLODipine (NORVASC) 10 MG tablet Take 10 mg by mouth daily.    [provider]  cephALEXin (KEFLEX) 500 MG capsule Take 1 capsule (500 mg total) by mouth 3 (three) times daily. 11/16/16   Quintella Reichert, MD  furosemide (LASIX) 20 MG tablet Take 20 mg by mouth daily. 06/09/14   [provider]  HYDROcodone-acetaminophen (NORCO) 7.5-325 MG tablet Take 1 tablet by mouth every 6 (six) hours as needed for moderate pain. 07/16/16   Veryl Speak, MD  metFORMIN (GLUCOPHAGE) 500 MG tablet Take 500 mg by mouth 2 (two) times daily. 08/15/14   [provider]  metoprolol (LOPRESSOR) 50 MG tablet Take 50 mg by mouth 2 (two) times daily.    [provider]  oxyCODONE-acetaminophen (PERCOCET) 7.5-325 MG per tablet Take 1 tablet by mouth every 6 (six) hours as needed. 08/20/14   [provider]  potassium chloride (K-DUR) 10 MEQ tablet Take 10 mEq by mouth 2 (two) times daily.    [provider]  rosuvastatin (CRESTOR) 10 MG tablet Take 10 mg by mouth daily.    [provider]  valsartan (DIOVAN) 80 MG tablet Take 80 mg by mouth daily.     [provider]    Physical Exam: Vitals:   06/22/22 2200 06/23/22 0000 06/23/22 0042 06/23/22 0145  BP: (!) 147/88 (!) 154/90 (!) 154/90 (!) 162/95  Pulse: 93 84 88 90  Resp: _0 Temp:   98.1 F (36.7 C) 97.9 F (36.6 C)  TempSrc:   Oral   SpO2: 100% 100% 100% 100%    Physical Exam Vitals reviewed.  Constitutional:      General: He is not in acute distress. HENT:     Head: Normocephalic and atraumatic.   Eyes:     Extraocular Movements: Extraocular movements intact.  Cardiovascular:     Rate and Rhythm: Normal rate and regular rhythm.     Pulses: Normal pulses.  Pulmonary:     Effort: Pulmonary effort is normal. No respiratory distress.     Breath sounds: Normal breath sounds.  Abdominal:     General: Bowel sounds are normal.     Palpations: Abdomen is soft.     Tenderness: There is no abdominal tenderness. There is no guarding.  Musculoskeletal:     Cervical back: Normal range of motion.     Comments: Feet warm to touch and dorsalis pedis pulse palpable bilaterally  Skin:    General: Skin is warm and dry.  Neurological:     General: No focal deficit present.     Mental Status: He is alert and oriented to person, place, and time.              Labs on Admission: I have personally reviewed following labs and imaging studies  CBC: Recent Labs  Lab 06/22/22 2006  WBC 8.9  NEUTROABS 6.7  HGB 9.9*  HCT 32.3*  MCV 73.7*  PLT 151   Basic Metabolic Panel: Recent Labs  Lab 06/22/22 2006  NA 136  K 3.3*  CL 97*  CO2 28  GLUCOSE 115*  BUN 17  CREATININE 0.99  CALCIUM 8.8*   GFR: CrCl cannot be calculated (Unknown ideal weight.). Liver Function Tests: Recent Labs  Lab 06/22/22 2006  AST 18  ALT 14  ALKPHOS 84  BILITOT 0.5  PROT 8.2*  ALBUMIN 3.1*   No results for input(s): "LIPASE", "AMYLASE" in the last 168 hours. No results for input(s): "AMMONIA" in the last 168 hours. Coagulation Profile: Recent Labs  Lab 06/22/22 2006  INR 1.1   Cardiac Enzymes: No results for input(s): "CKTOTAL", "CKMB", "CKMBINDEX", "TROPONINI" in the last 168 hours. BNP (last 3 results) No results for input(s): "PROBNP" in the last 8760 hours. HbA1C: No results for input(s): "HGBA1C" in the last 72 hours. CBG: No results for input(s): "GLUCAP" in the last 168 hours. Lipid Profile: No results for input(s): "CHOL", "HDL", "LDLCALC", "TRIG", "CHOLHDL", "LDLDIRECT" in  the last 72 hours. Thyroid Function Tests: No results for input(s): "TSH", "T4TOTAL", "FREET4", "T3FREE", "THYROIDAB" in the last 72 hours. Anemia Panel: No results for input(s): "VITAMINB12", "FOLATE", "FERRITIN", "TIBC", "IRON", "RETICCTPCT" in the last 72 hours. Urine analysis:    Component Value Date/Time   COLORURINE YELLOW 11/16/2016 1409   APPEARANCEUR TURBID (A) 11/16/2016 1409   LABSPEC 1.012 11/16/2016 1409   PHURINE 7.5 11/16/2016 1409   GLUCOSEU NEGATIVE 11/16/2016 1409   HGBUR LARGE (A) 11/16/2016 1409   BILIRUBINUR NEGATIVE 11/16/2016 1409   KETONESUR NEGATIVE 11/16/2016 1409   PROTEINUR 30 (A) 11/16/2016 1409   NITRITE NEGATIVE 11/16/2016 1409  LEUKOCYTESUR LARGE (A) 11/16/2016 1409    Radiological Exams on Admission: DG Tibia/Fibula Right  Result Date: 06/22/2022 CLINICAL DATA:  Foot wound EXAM: RIGHT TIBIA AND FIBULA - 2 VIEW COMPARISON:  None Available. FINDINGS: No fracture or malalignment. Soft tissue ulcer or wound at the distal lower leg on the lateral side. No definite underlying periostitis or osseous destructive changes. Generalized soft tissue edema. Advanced arthritis at the knee. IMPRESSION: No definite acute osseous abnormality. Wound or ulcer distal lateral lower leg Electronically Signed   By: Donavan Foil M.D.   On: 06/22/2022 21:33   DG Foot 2 Views Right  Result Date: 06/22/2022 CLINICAL DATA:  Foot wound EXAM: RIGHT FOOT - 2 VIEW COMPARISON:  None Available. FINDINGS: Bones appear demineralized. Large plantar calcaneal spur. Tarsal degenerative change. Moderate arthritis at the first IP and MTP joints. Partial osseous bridging across the second MTP joint. Ulcer lateral aspect of the foot. Erosion at the base of the fifth proximal phalanx. Erosion and osseous destructive change involving the distal shaft and head of the fifth metacarpal. IMPRESSION: 1. Positive for osteomyelitis involving the base of the fifth proximal phalanx and the mid to distal  fifth metatarsal with overlying wound Electronically Signed   By: Donavan Foil M.D.   On: 06/22/2022 21:25   DG Foot Complete Left  Result Date: 06/22/2022 CLINICAL DATA:  Foot wounds EXAM: LEFT FOOT - COMPLETE 3+ VIEW COMPARISON:  None Available. FINDINGS: Bones appear demineralized. Degenerative changes at the TMT joints. Ulcer lateral aspect of the foot at the level of fifth MTP joint. Erosive change at the base of the fifth proximal phalanx and head of the fifth metatarsal, consistent with osteomyelitis. Possible pathologic fracture deformity at the base of the fifth proximal phalanx. Moderate degenerative change at the first IP and MTP joints. Small gas in the soft tissues could relate to ulcer or necrotizing infection. IMPRESSION: 1. Soft tissue ulcer lateral aspect of the foot at the level of the fifth MTP joint with evidence for osteomyelitis involving the base of the fifth proximal phalanx and the head of the fifth metatarsal. Small amount of soft tissue gas could relate to the patient's wound or be due to necrotic infection Electronically Signed   By: Donavan Foil M.D.   On: 06/22/2022 21:23   DG Chest 2 View  Result Date: 06/22/2022 CLINICAL DATA:  Wounds possible sepsis EXAM: CHEST - 2 VIEW COMPARISON:  Chest x-ray 12/01/2021, chest CT 11/26/2021, abdomen radiograph 12/01/2021 FINDINGS: No acute airspace disease or effusion. Stable cardiomediastinal silhouette, right hilar convex opacity felt related to ectatic ascending aorta on chest CT. No pneumothorax. Probable prominent air distended bowel loops in the upper abdomen beneath the diaphragms IMPRESSION: 1. No radiographic evidence for acute cardiopulmonary abnormality 2. Lucency beneath the diaphragms probably due to marked air distension of bowel given appearance on lateral view but recommend dedicated abdominal radiograph and/or CT for further assessment. Electronically Signed   By: Donavan Foil M.D.   On: 06/22/2022 19:53    EKG:  Ordered and currently pending.  Assessment and Plan  Bilateral lower extremity ulcers/osteomyelitis -Lactate mildly elevated and improved after fluid bolus.  No fever, tachycardia, tachypnea, hypotension, or leukocytosis to suggest sepsis. -X-ray of left foot showing osteomyelitis involving the base of the fifth proximal phalanx and the head of the fifth metatarsal.  Also showing a small amount of soft tissue gas which could relate to the patient's wound or be due to necrotic infection.  -X-ray of right foot  showing osteomyelitis involving the base of the fifth proximal phalanx in the mid to distal fifth metatarsal.  -Orthopedics consulted, keep n.p.o. -Continue antibiotics -Blood cultures pending -ABIs ordered -Pain management  Type 2 diabetes -Check A1c -Sensitive sliding scale insulin every 4 hours -Hold oral hypoglycemic agents  Hypertension -Blood pressure elevated with systolic in the 370W to 888B -Continue pain management -IV hydralazine prn -Continue home medications after pharmacy med rec is done  Hyperlipidemia -Continue statin after pharmacy med rec is done.  Chronic anemia -Hemoglobin stable, continue to monitor  Mild hypokalemia -Monitor potassium and continue to replace if low  Abnormal imaging finding Chest x-ray done in the ED showing lucency beneath the diaphragms probably due to marked air distention of bowel. -Abdominal x-ray ordered for further evaluation  DVT prophylaxis: SCDs Code Status: Full Code (discussed with the patient) Family Communication: No family available at this time. Consults called: Orthopedics Level of care: Telemetry bed Admission status: It is my clinical opinion that admission to INPATIENT is reasonable and necessary because of the expectation that this patient will require hospital care that crosses at least 2 midnights to treat this condition based on the medical complexity of the problems presented.  Given the aforementioned  information, the predictability of an adverse outcome is felt to be significant.   Shela Leff MD [facility]  If 7PM-7AM, please contact night-coverage [email or web address]  06/23/2022, 4:04 AM

## 2022-06-23 NOTE — Progress Notes (Signed)
Pharmacy Antibiotic Note  Jeffrey Fields is a 66 y.o. male admitted on 06/22/2022 with cellulitis.  Pharmacy has been consulted for Vanc and cefepime dosing.  Vancomycin 750  mg IV Q 12 hrs. Goal AUC 400-550. Expected AUC: 474 SCr used: 0.99   Plan: Vanc 750 q12hr (at noon today, as patient got 2 gms overnight) Cefepime 2 gms IV q8hr Monitor renal function, clinical status, C&S and vanc levels as needed  Height: 6' 3.5" (191.8 cm) Weight: 83.1 kg (183 lb 3.2 oz) IBW/kg (Calculated) : 85.65  Temp (24hrs), Avg:98.1 F (36.7 C), Min:97.9 F (36.6 C), Max:98.2 F (36.8 C)  Recent Labs  Lab 06/22/22 2006 06/23/22 0038  WBC 8.9  --   CREATININE 0.99  --   LATICACIDVEN 2.3* 2.0*    Estimated Creatinine Clearance: 85.1 mL/min (by C-G formula based on SCr of 0.99 mg/dL).    No Known Allergies  Antimicrobials this admission: Cefepime 9/16 >>  Vanc 9/16 >>   Thank you for allowing pharmacy to be a part of this patient's care.  Alanda Slim, PharmD, [facility] Clinical Pharmacist Please see AMION for all Pharmacists' Contact Phone Numbers 06/23/2022, 5:31 AM

## 2022-06-23 NOTE — H&P (View-Only) (Signed)
ORTHOPAEDIC CONSULTATION  REQUESTING PHYSICIAN: Bonnielee Haff, MD  Chief Complaint: Chronic ulceration bilateral lower extremities.  HPI: Jeffrey Fields is a 66 y.o. 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  Patient has an aide that provides care at home.  Past Medical History:  Diagnosis Date   Diabetes mellitus    Hypertension    Paralysis ([place])    Spinal stenosis    Past Surgical History:  Procedure Laterality Date   BACK SURGERY     Social History   Socioeconomic History   Marital status: Single    Spouse name: Not on file   Number of children: Not on file   Years of education: Not on file   Highest education level: Not on file  Occupational History   Not on file  Tobacco Use   Smoking status: Never   Smokeless tobacco: Never  Substance and Sexual Activity   Alcohol use: No   Drug use: No   Sexual activity: Not on file  Other Topics Concern   Not on file  Social History Narrative   Not on file   Social Determinants of Health   Financial Resource Strain: Not on file  Food Insecurity: Unknown (06/23/2022)   Hunger Vital Sign    Worried About Running Out of Food in the Last Year: Patient refused    Ran Out of Food in the Last Year: Patient refused  Transportation Needs: Not on file  Physical Activity: Not on file  Stress: Not on file  Social Connections: Not on file   History reviewed. No pertinent family history. - negative except otherwise stated in the family history section No Known Allergies Prior to Admission medications   Medication Sig Start Date End Date Taking? Authorizing Provider  amLODipine (NORVASC) 10 MG tablet Take 10 mg by mouth daily.   Yes [provider]  FARXIGA 10 MG TABS tablet Take 10 mg by mouth daily. 04/24/22  Yes [provider]  furosemide (LASIX) 20 MG tablet Take 20 mg by mouth daily. 06/09/14  Yes [provider]  lactose free nutrition  (BOOST) LIQD Take 237 mLs by mouth in the morning and at bedtime.   Yes [provider]  lisinopril (ZESTRIL) 10 MG tablet Take 10 mg by mouth 2 (two) times daily. 05/29/22  Yes [provider]  metoprolol (LOPRESSOR) 50 MG tablet Take 50 mg by mouth 2 (two) times daily.   Yes [provider]  nitroGLYCERIN (NITROSTAT) 0.4 MG SL tablet Place 0.4 mg under the tongue every 5 (five) minutes as needed for chest pain. 06/01/22  Yes [provider]  oxyCODONE-acetaminophen (PERCOCET) 7.5-325 MG per tablet Take 1 tablet by mouth every 6 (six) hours as needed for moderate pain. 08/20/14  Yes [provider]  potassium chloride SA (KLOR-CON M) 20 MEQ tablet Take 20 mEq by mouth daily. 04/24/22  Yes [provider]  rosuvastatin (CRESTOR) 10 MG tablet Take 10 mg by mouth 2 (two) times a week.   Yes [provider]  SSD 1 % cream Apply 1 Application topically daily. 04/24/22  Yes [provider]  cephALEXin (KEFLEX) 500 MG capsule Take 1 capsule (500 mg total) by mouth 3 (three) times daily. Patient not taking: Reported on 06/23/2022 11/16/16   Quintella Reichert, MD  metFORMIN (GLUCOPHAGE) 500 MG tablet Take 500 mg by mouth 2 (two) times daily. Patient not taking: Reported on 06/23/2022 08/15/14   [provider]  DG Abd 1 View  Result Date: 06/23/2022 CLINICAL DATA:  Subdiaphragmatic lucency seen on chest x-ray. EXAM: ABDOMEN - 1 VIEW COMPARISON:  Chest x-ray from yesterday. FINDINGS: Mild diffusely distended colon containing air and stool accounts for the lucency seen on chest x-ray. No definite pneumoperitoneum. No obstruction. No radio-opaque calculi or other significant radiographic abnormality are seen. No acute osseous abnormality. IMPRESSION: 1. No acute findings. Prominent colon distended with air and stool accounts for the findings on chest x-ray. Correlate for constipation. Electronically Signed   By: Titus Dubin M.D.   On:  06/23/2022 09:46   DG Tibia/Fibula Right  Result Date: 06/22/2022 CLINICAL DATA:  Foot wound EXAM: RIGHT TIBIA AND FIBULA - 2 VIEW COMPARISON:  None Available. FINDINGS: No fracture or malalignment. Soft tissue ulcer or wound at the distal lower leg on the lateral side. No definite underlying periostitis or osseous destructive changes. Generalized soft tissue edema. Advanced arthritis at the knee. IMPRESSION: No definite acute osseous abnormality. Wound or ulcer distal lateral lower leg Electronically Signed   By: Donavan Foil M.D.   On: 06/22/2022 21:33   DG Foot 2 Views Right  Result Date: 06/22/2022 CLINICAL DATA:  Foot wound EXAM: RIGHT FOOT - 2 VIEW COMPARISON:  None Available. FINDINGS: Bones appear demineralized. Large plantar calcaneal spur. Tarsal degenerative change. Moderate arthritis at the first IP and MTP joints. Partial osseous bridging across the second MTP joint. Ulcer lateral aspect of the foot. Erosion at the base of the fifth proximal phalanx. Erosion and osseous destructive change involving the distal shaft and head of the fifth metacarpal. IMPRESSION: 1. Positive for osteomyelitis involving the base of the fifth proximal phalanx and the mid to distal fifth metatarsal with overlying wound Electronically Signed   By: Donavan Foil M.D.   On: 06/22/2022 21:25   DG Foot Complete Left  Result Date: 06/22/2022 CLINICAL DATA:  Foot wounds EXAM: LEFT FOOT - COMPLETE 3+ VIEW COMPARISON:  None Available. FINDINGS: Bones appear demineralized. Degenerative changes at the TMT joints. Ulcer lateral aspect of the foot at the level of fifth MTP joint. Erosive change at the base of the fifth proximal phalanx and head of the fifth metatarsal, consistent with osteomyelitis. Possible pathologic fracture deformity at the base of the fifth proximal phalanx. Moderate degenerative change at the first IP and MTP joints. Small gas in the soft tissues could relate to ulcer or necrotizing infection.  IMPRESSION: 1. Soft tissue ulcer lateral aspect of the foot at the level of the fifth MTP joint with evidence for osteomyelitis involving the base of the fifth proximal phalanx and the head of the fifth metatarsal. Small amount of soft tissue gas could relate to the patient's wound or be due to necrotic infection Electronically Signed   By: Donavan Foil M.D.   On: 06/22/2022 21:23   DG Chest 2 View  Result Date: 06/22/2022 CLINICAL DATA:  Wounds possible sepsis EXAM: CHEST - 2 VIEW COMPARISON:  Chest x-ray 12/01/2021, chest CT 11/26/2021, abdomen radiograph 12/01/2021 FINDINGS: No acute airspace disease or effusion. Stable cardiomediastinal silhouette, right hilar convex opacity felt related to ectatic ascending aorta on chest CT. No pneumothorax. Probable prominent air distended bowel loops in the upper abdomen beneath the diaphragms IMPRESSION: 1. No radiographic evidence for acute cardiopulmonary abnormality 2. Lucency beneath the diaphragms probably due to marked air distension of bowel given appearance on lateral view but recommend dedicated abdominal radiograph and/or CT for further assessment. Electronically Signed   By: Madie Reno.D.  On: 06/22/2022 19:53   - pertinent xrays, CT, MRI studies were reviewed and independently interpreted  Positive ROS: All other systems have been reviewed and were otherwise negative with the exception of those mentioned in the HPI and as above.  Physical Exam: General: Alert, no acute distress Psychiatric: Patient is competent for consent with normal mood and affect Lymphatic: No axillary or cervical lymphadenopathy Cardiovascular: No pedal edema Respiratory: No cyanosis, no use of accessory musculature GI: No organomegaly, abdomen is soft and non-tender    Images:  @ENCIMAGES @  Labs:  Lab Results  Component Value Date   HGBA1C 4.9 06/23/2022   ESRSEDRATE 79 (H) 06/22/2022   CRP 10.0 (H) 06/22/2022   REPTSTATUS PENDING 06/22/2022   CULT   06/22/2022    NO GROWTH < 12 HOURS Performed at [facility] [address] [place], [place] [address]    LABORGA KLEBSIELLA PNEUMONIAE (A) 11/16/2016    Lab Results  Component Value Date   ALBUMIN 3.1 (L) 06/22/2022   ALBUMIN 3.9 11/16/2016        Latest Ref Rng & Units 06/23/2022    8:31 AM 06/22/2022    8:06 PM 11/16/2016    3:56 PM  CBC EXTENDED  WBC 4.0 - 10.5 K/uL 9.0  8.9  7.6   RBC 4.22 - 5.81 MIL/uL 3.80  4.38  4.74   Hemoglobin 13.0 - 17.0 g/dL 8.6  9.9  11.3   HCT 39.0 - 52.0 % 28.0  32.3  36.6   Platelets 150 - 400 K/uL 336  391  286   NEUT# 1.7 - 7.7 K/uL  6.7  5.0   Lymph# 0.7 - 4.0 K/uL  1.2  1.9     Neurologic: Patient does not have protective sensation bilateral lower extremities.   MUSCULOSKELETAL:   Skin: Examination patient has a necrotic ulcer with plantar aspect right foot with necrotic tendon and bone.  Patient also has a necrotic ulcer over the fibula right ankle with exposed bone.  Left foot shows ulceration over the fifth metatarsal head base of the fifth metatarsal and a decubitus heel ulcer.  Patient has a knee flexion contracture of both lower extremities at about 30 degrees.  Patient has a faintly palpable popliteal pulse.  Hemoglobin 8.6 with a white cell count of 9.0.  Albumin 3.1 hemoglobin A1c 4.9 with a sed rate of 79 and a C-reactive protein of 10. Assessment: Peripheral vascular disease with paralysis both lower extremities, with knee flexion contracture bilaterally, with osteomyelitis and ulceration of the right hindfoot and right ankle with ulcerations to the left foot.  Plan: Plan: I discussed with patient recommendation to proceed with an above-the-knee amputation on the right lower extremity.  Ankle-brachial indices are pending. Will start wound care for the left lower extremity.  Thank you for the consult and the opportunity to see Mr. Fields   , [place] [phone] 10:08 AM

## 2022-06-23 NOTE — Progress Notes (Signed)
[facility] PROGRESS NOTE   Jeffrey Fields UXN:[id number] DOB: Jan 23, 1956 DOA: 06/22/2022  PCP: [name], DO  Brief History/Interval Summary:  66 y.o. male with medical history significant of type 2 diabetes, hypertension, hyperlipidemia, paraplegia secondary to spinal stenosis presented to the ED with complaint of bilateral lower extremity/foot wounds.  Imaging studies raised concern for osteomyelitis involving bilateral feet.  Patient was hospitalized.  Orthopedics was consulted.    Consultants: Dr. Doreatha Martin with orthopedics  Procedures: None yet    Subjective/Interval History: Patient denies any chest pain shortness of breath nausea or vomiting.  Pain in the feet is reasonably well controlled.    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  No clear evidence for sepsis otherwise. Imaging studies raise concern for osteomyelitis involving the base of the fifth proximal phalanx of the left foot and head of the fifth metatarsal.  Soft tissue gas was also noted.  There was also concern for stomatitis of the right fifth proximal phalanx and the mid to distal fifth metatarsal. Orthopedics consulted.  Patient started on broad-spectrum antibiotics with vancomycin and cefepime. ABIs to be obtained.  CRP noted to be 10.0.  ESR 79. Follow-up on blood cultures. Per orthopedics there is no plan for surgical intervention this weekend.  Diabetes mellitus type 2, controlled HbA1c 4.9.  Holding oral agents.  SSI.  Essential hypertension Monitor blood pressures closely.  Hydralazine as needed.  We will reinitiate his home medications.  Hyperlipidemia Noted to be on statin.  Anemia likely of chronic disease Mild drop in hemoglobin noted likely dilutional.  No evidence of overt bleeding.  Check anemia panel if not done recently.  Paraplegia This is  secondary to spinal stenosis.  Long history of same, ongoing for 20+ years.  Hypokalemia Will be repleted.  Check magnesium level.  Concern for air under the diaphragm Abdominal films showed that this finding is due to gas in the bowels.  Abdomen is benign on examination.  Bowel regimen   DVT Prophylaxis: Initiate Lovenox Code Status: Full code Family Communication: Discussed with patient Disposition Plan: To be determined.  He does live by himself but he has good support from family.  Status is: Inpatient Remains inpatient appropriate because: Need for IV antibiotics, possible need for surgical intervention      Medications: Scheduled:  collagenase   Topical Daily   insulin aspart  0-9 Units Subcutaneous Q4H   Continuous:  ceFEPime (MAXIPIME) IV 2 g (06/23/22 0552)   vancomycin     URK:[id number] **OR** acetaminophen, hydrALAZINE, oxyCODONE-acetaminophen  Antibiotics: Anti-infectives (From admission, onward)    Start     Dose/Rate Route Frequency Ordered Stop   06/23/22 1200  vancomycin (VANCOREADY) IVPB 750 mg/150 mL        750 mg 150 mL/hr over 60 Minutes Intravenous Every 12 hours 06/23/22 0528     06/23/22 0615  ceFEPIme (MAXIPIME) 2 g in sodium chloride 0.9 % 100 mL IVPB        2 g 200 mL/hr over 30 Minutes Intravenous Every 8 hours 06/23/22 0526     06/22/22 2145  vancomycin (VANCOREADY) IVPB 2000 mg/400 mL  Status:  Discontinued        2,000 mg 200 mL/hr over 120 Minutes Intravenous  Once 06/22/22 2134 06/22/22 2138   06/22/22 2145  cefTRIAXone (ROCEPHIN) 2 g in sodium chloride 0.9 % 100 mL IVPB  2 g 200 mL/hr over 30 Minutes Intravenous  Once 06/22/22 2134 06/22/22 2330   06/22/22 2145  vancomycin (VANCOCIN) IVPB 1000 mg/200 mL premix       See Hyperspace for full Linked Orders Report.   1,000 mg 200 mL/hr over 60 Minutes Intravenous  Once 06/22/22 2137 06/23/22 0057   06/22/22 2145  vancomycin (VANCOCIN) IVPB 1000 mg/200 mL premix       See  Hyperspace for full Linked Orders Report.   1,000 mg 200 mL/hr over 60 Minutes Intravenous  Once 06/22/22 2137 06/23/22 0057       Objective:  Vital Signs  Vitals:   06/23/22 0042 06/23/22 0145 06/23/22 0415 06/23/22 0750  BP: (!) 154/90 (!) 162/95 (!) 172/87 (!) 156/84  Pulse: 88 90 87 96  Resp: '20 16 14 17  ' Temp: 98.1 F (36.7 C) 97.9 F (36.6 C) 98.2 F (36.8 C) 98.3 F (36.8 C)  TempSrc: Oral  Oral Oral  SpO2: 100% 100% 100% 100%  Weight:  83.1 kg    Height:  6' 3.5" (1.918 m)      Intake/Output Summary (Last 24 hours) at 06/23/2022 1013 Last data filed at 06/23/2022 0800 Gross per 24 hour  Intake 1585.46 ml  Output 1600 ml  Net -14.54 ml   Filed Weights   06/23/22 0145  Weight: 83.1 kg    General appearance: Awake alert.  In no distress Resp: Clear to auscultation bilaterally.  Normal effort Cardio: S1-S2 is normal regular.  No S3-S4.  No rubs murmurs or bruit GI: Abdomen is soft.  Nontender nondistended.  Bowel sounds are present normal.  No masses organomegaly Extremities: Both lower remedies covered in dressing Neurologic: Alert and oriented x3.  He is paraplegic   Lab Results:  Data Reviewed: I have personally reviewed following labs and reports of the imaging studies  CBC: Recent Labs  Lab 06/22/22 2006 06/23/22 0831  WBC 8.9 9.0  NEUTROABS 6.7  --   HGB 9.9* 8.6*  HCT 32.3* 28.0*  MCV 73.7* 73.7*  PLT 391 716    Basic Metabolic Panel: Recent Labs  Lab 06/22/22 2006 06/23/22 0831  NA 136 138  K 3.3* 3.0*  CL 97* 104  CO2 28 29  GLUCOSE 115* 85  BUN 17 10  CREATININE 0.99 0.82  CALCIUM 8.8* 8.2*    GFR: Estimated Creatinine Clearance: 102.7 mL/min (by C-G formula based on SCr of 0.82 mg/dL).  Liver Function Tests: Recent Labs  Lab 06/22/22 2006  AST 18  ALT 14  ALKPHOS 84  BILITOT 0.5  PROT 8.2*  ALBUMIN 3.1*     Coagulation Profile: Recent Labs  Lab 06/22/22 2006  INR 1.1     HbA1C: Recent Labs     06/23/22 0831  HGBA1C 4.9    CBG: Recent Labs  Lab 06/23/22 0921  GLUCAP 99     Recent Results (from the past 240 hour(s))  Culture, blood (Routine x 2)     Status: None (Preliminary result)   Collection Time: 06/22/22  8:50 PM   Specimen: BLOOD RIGHT FOREARM  Result Value Ref Range Status   Specimen Description   Final    BLOOD RIGHT FOREARM BLOOD Performed at [facility], [address] [place], [place] [address]    Special Requests   Final    Blood Culture adequate volume BOTTLES DRAWN AEROBIC AND ANAEROBIC Performed at [facility], [address] [place], [place] [address]    Culture  Final    NO GROWTH < 12 HOURS Performed at [facility] [address] [place], [place] [address]    Report Status PENDING  Incomplete  Culture, blood (Routine x 2)     Status: None (Preliminary result)   Collection Time: 06/22/22  8:59 PM   Specimen: Right Antecubital; Blood  Result Value Ref Range Status   Specimen Description   Final    RIGHT ANTECUBITAL BLOOD Performed at [facility], [address] [place], [place] [address]    Special Requests   Final    Blood Culture adequate volume BOTTLES DRAWN AEROBIC AND ANAEROBIC Performed at [facility], [address] [place], [place] [address]    Culture   Final    NO GROWTH < 12 HOURS Performed at [facility], [place] [address], [place], [place] [address]    Report Status PENDING  Incomplete      Radiology Studies: DG Abd 1 View  Result Date: 06/23/2022 CLINICAL DATA:  Subdiaphragmatic lucency seen on chest x-ray. EXAM: ABDOMEN - 1 VIEW COMPARISON:  Chest x-ray from yesterday. FINDINGS: Mild diffusely distended colon containing air and stool accounts for the lucency seen on chest x-ray. No definite pneumoperitoneum. No obstruction. No radio-opaque calculi or other significant radiographic abnormality are seen. No acute osseous abnormality. IMPRESSION: 1. No acute  findings. Prominent colon distended with air and stool accounts for the findings on chest x-ray. Correlate for constipation. Electronically Signed   By: Titus Dubin M.D.   On: 06/23/2022 09:46   DG Tibia/Fibula Right  Result Date: 06/22/2022 CLINICAL DATA:  Foot wound EXAM: RIGHT TIBIA AND FIBULA - 2 VIEW COMPARISON:  None Available. FINDINGS: No fracture or malalignment. Soft tissue ulcer or wound at the distal lower leg on the lateral side. No definite underlying periostitis or osseous destructive changes. Generalized soft tissue edema. Advanced arthritis at the knee. IMPRESSION: No definite acute osseous abnormality. Wound or ulcer distal lateral lower leg Electronically Signed   By: Donavan Foil M.D.   On: 06/22/2022 21:33   DG Foot 2 Views Right  Result Date: 06/22/2022 CLINICAL DATA:  Foot wound EXAM: RIGHT FOOT - 2 VIEW COMPARISON:  None Available. FINDINGS: Bones appear demineralized. Large plantar calcaneal spur. Tarsal degenerative change. Moderate arthritis at the first IP and MTP joints. Partial osseous bridging across the second MTP joint. Ulcer lateral aspect of the foot. Erosion at the base of the fifth proximal phalanx. Erosion and osseous destructive change involving the distal shaft and head of the fifth metacarpal. IMPRESSION: 1. Positive for osteomyelitis involving the base of the fifth proximal phalanx and the mid to distal fifth metatarsal with overlying wound Electronically Signed   By: Donavan Foil M.D.   On: 06/22/2022 21:25   DG Foot Complete Left  Result Date: 06/22/2022 CLINICAL DATA:  Foot wounds EXAM: LEFT FOOT - COMPLETE 3+ VIEW COMPARISON:  None Available. FINDINGS: Bones appear demineralized. Degenerative changes at the TMT joints. Ulcer lateral aspect of the foot at the level of fifth MTP joint. Erosive change at the base of the fifth proximal phalanx and head of the fifth metatarsal, consistent with osteomyelitis. Possible pathologic fracture deformity at the base  of the fifth proximal phalanx. Moderate degenerative change at the first IP and MTP joints. Small gas in the soft tissues could relate to ulcer or necrotizing infection. IMPRESSION: 1. Soft tissue ulcer lateral aspect of the foot at the level of the fifth MTP  joint with evidence for osteomyelitis involving the base of the fifth proximal phalanx and the head of the fifth metatarsal. Small amount of soft tissue gas could relate to the patient's wound or be due to necrotic infection Electronically Signed   By: Donavan Foil M.D.   On: 06/22/2022 21:23   DG Chest 2 View  Result Date: 06/22/2022 CLINICAL DATA:  Wounds possible sepsis EXAM: CHEST - 2 VIEW COMPARISON:  Chest x-ray 12/01/2021, chest CT 11/26/2021, abdomen radiograph 12/01/2021 FINDINGS: No acute airspace disease or effusion. Stable cardiomediastinal silhouette, right hilar convex opacity felt related to ectatic ascending aorta on chest CT. No pneumothorax. Probable prominent air distended bowel loops in the upper abdomen beneath the diaphragms IMPRESSION: 1. No radiographic evidence for acute cardiopulmonary abnormality 2. Lucency beneath the diaphragms probably due to marked air distension of bowel given appearance on lateral view but recommend dedicated abdominal radiograph and/or CT for further assessment. Electronically Signed   By: Donavan Foil M.D.   On: 06/22/2022 19:53       LOS: 0 days   [facility] Pager on [email or web address]  06/23/2022, 10:13 AM

## 2022-06-23 NOTE — Consult Note (Signed)
ORTHOPAEDIC CONSULTATION  REQUESTING PHYSICIAN: Bonnielee Haff, MD  Chief Complaint: Chronic ulceration bilateral lower extremities.  HPI: Jeffrey Fields is a 66 y.o. 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  Patient has an aide that provides care at home.  Past Medical History:  Diagnosis Date   Diabetes mellitus    Hypertension    Paralysis ([place])    Spinal stenosis    Past Surgical History:  Procedure Laterality Date   BACK SURGERY     Social History   Socioeconomic History   Marital status: Single    Spouse name: Not on file   Number of children: Not on file   Years of education: Not on file   Highest education level: Not on file  Occupational History   Not on file  Tobacco Use   Smoking status: Never   Smokeless tobacco: Never  Substance and Sexual Activity   Alcohol use: No   Drug use: No   Sexual activity: Not on file  Other Topics Concern   Not on file  Social History Narrative   Not on file   Social Determinants of Health   Financial Resource Strain: Not on file  Food Insecurity: Unknown (06/23/2022)   Hunger Vital Sign    Worried About Running Out of Food in the Last Year: Patient refused    Ran Out of Food in the Last Year: Patient refused  Transportation Needs: Not on file  Physical Activity: Not on file  Stress: Not on file  Social Connections: Not on file   History reviewed. No pertinent family history. - negative except otherwise stated in the family history section No Known Allergies Prior to Admission medications   Medication Sig Start Date End Date Taking? Authorizing Provider  amLODipine (NORVASC) 10 MG tablet Take 10 mg by mouth daily.   Yes [provider]  FARXIGA 10 MG TABS tablet Take 10 mg by mouth daily. 04/24/22  Yes [provider]  furosemide (LASIX) 20 MG tablet Take 20 mg by mouth daily. 06/09/14  Yes [provider]  lactose free nutrition  (BOOST) LIQD Take 237 mLs by mouth in the morning and at bedtime.   Yes [provider]  lisinopril (ZESTRIL) 10 MG tablet Take 10 mg by mouth 2 (two) times daily. 05/29/22  Yes [provider]  metoprolol (LOPRESSOR) 50 MG tablet Take 50 mg by mouth 2 (two) times daily.   Yes [provider]  nitroGLYCERIN (NITROSTAT) 0.4 MG SL tablet Place 0.4 mg under the tongue every 5 (five) minutes as needed for chest pain. 06/01/22  Yes [provider]  oxyCODONE-acetaminophen (PERCOCET) 7.5-325 MG per tablet Take 1 tablet by mouth every 6 (six) hours as needed for moderate pain. 08/20/14  Yes [provider]  potassium chloride SA (KLOR-CON M) 20 MEQ tablet Take 20 mEq by mouth daily. 04/24/22  Yes [provider]  rosuvastatin (CRESTOR) 10 MG tablet Take 10 mg by mouth 2 (two) times a week.   Yes [provider]  SSD 1 % cream Apply 1 Application topically daily. 04/24/22  Yes [provider]  cephALEXin (KEFLEX) 500 MG capsule Take 1 capsule (500 mg total) by mouth 3 (three) times daily. Patient not taking: Reported on 06/23/2022 11/16/16   Quintella Reichert, MD  metFORMIN (GLUCOPHAGE) 500 MG tablet Take 500 mg by mouth 2 (two) times daily. Patient not taking: Reported on 06/23/2022 08/15/14   [provider]  DG Abd 1 View  Result Date: 06/23/2022 CLINICAL DATA:  Subdiaphragmatic lucency seen on chest x-ray. EXAM: ABDOMEN - 1 VIEW COMPARISON:  Chest x-ray from yesterday. FINDINGS: Mild diffusely distended colon containing air and stool accounts for the lucency seen on chest x-ray. No definite pneumoperitoneum. No obstruction. No radio-opaque calculi or other significant radiographic abnormality are seen. No acute osseous abnormality. IMPRESSION: 1. No acute findings. Prominent colon distended with air and stool accounts for the findings on chest x-ray. Correlate for constipation. Electronically Signed   By: Titus Dubin M.D.   On:  06/23/2022 09:46   DG Tibia/Fibula Right  Result Date: 06/22/2022 CLINICAL DATA:  Foot wound EXAM: RIGHT TIBIA AND FIBULA - 2 VIEW COMPARISON:  None Available. FINDINGS: No fracture or malalignment. Soft tissue ulcer or wound at the distal lower leg on the lateral side. No definite underlying periostitis or osseous destructive changes. Generalized soft tissue edema. Advanced arthritis at the knee. IMPRESSION: No definite acute osseous abnormality. Wound or ulcer distal lateral lower leg Electronically Signed   By: Donavan Foil M.D.   On: 06/22/2022 21:33   DG Foot 2 Views Right  Result Date: 06/22/2022 CLINICAL DATA:  Foot wound EXAM: RIGHT FOOT - 2 VIEW COMPARISON:  None Available. FINDINGS: Bones appear demineralized. Large plantar calcaneal spur. Tarsal degenerative change. Moderate arthritis at the first IP and MTP joints. Partial osseous bridging across the second MTP joint. Ulcer lateral aspect of the foot. Erosion at the base of the fifth proximal phalanx. Erosion and osseous destructive change involving the distal shaft and head of the fifth metacarpal. IMPRESSION: 1. Positive for osteomyelitis involving the base of the fifth proximal phalanx and the mid to distal fifth metatarsal with overlying wound Electronically Signed   By: Donavan Foil M.D.   On: 06/22/2022 21:25   DG Foot Complete Left  Result Date: 06/22/2022 CLINICAL DATA:  Foot wounds EXAM: LEFT FOOT - COMPLETE 3+ VIEW COMPARISON:  None Available. FINDINGS: Bones appear demineralized. Degenerative changes at the TMT joints. Ulcer lateral aspect of the foot at the level of fifth MTP joint. Erosive change at the base of the fifth proximal phalanx and head of the fifth metatarsal, consistent with osteomyelitis. Possible pathologic fracture deformity at the base of the fifth proximal phalanx. Moderate degenerative change at the first IP and MTP joints. Small gas in the soft tissues could relate to ulcer or necrotizing infection.  IMPRESSION: 1. Soft tissue ulcer lateral aspect of the foot at the level of the fifth MTP joint with evidence for osteomyelitis involving the base of the fifth proximal phalanx and the head of the fifth metatarsal. Small amount of soft tissue gas could relate to the patient's wound or be due to necrotic infection Electronically Signed   By: Donavan Foil M.D.   On: 06/22/2022 21:23   DG Chest 2 View  Result Date: 06/22/2022 CLINICAL DATA:  Wounds possible sepsis EXAM: CHEST - 2 VIEW COMPARISON:  Chest x-ray 12/01/2021, chest CT 11/26/2021, abdomen radiograph 12/01/2021 FINDINGS: No acute airspace disease or effusion. Stable cardiomediastinal silhouette, right hilar convex opacity felt related to ectatic ascending aorta on chest CT. No pneumothorax. Probable prominent air distended bowel loops in the upper abdomen beneath the diaphragms IMPRESSION: 1. No radiographic evidence for acute cardiopulmonary abnormality 2. Lucency beneath the diaphragms probably due to marked air distension of bowel given appearance on lateral view but recommend dedicated abdominal radiograph and/or CT for further assessment. Electronically Signed   By: Madie Reno.D.  On: 06/22/2022 19:53   - pertinent xrays, CT, MRI studies were reviewed and independently interpreted  Positive ROS: All other systems have been reviewed and were otherwise negative with the exception of those mentioned in the HPI and as above.  Physical Exam: General: Alert, no acute distress Psychiatric: Patient is competent for consent with normal mood and affect Lymphatic: No axillary or cervical lymphadenopathy Cardiovascular: No pedal edema Respiratory: No cyanosis, no use of accessory musculature GI: No organomegaly, abdomen is soft and non-tender    Images:  @ENCIMAGES @  Labs:  Lab Results  Component Value Date   HGBA1C 4.9 06/23/2022   ESRSEDRATE 79 (H) 06/22/2022   CRP 10.0 (H) 06/22/2022   REPTSTATUS PENDING 06/22/2022   CULT   06/22/2022    NO GROWTH < 12 HOURS Performed at [facility] [address] [place], [place] [address]    LABORGA KLEBSIELLA PNEUMONIAE (A) 11/16/2016    Lab Results  Component Value Date   ALBUMIN 3.1 (L) 06/22/2022   ALBUMIN 3.9 11/16/2016        Latest Ref Rng & Units 06/23/2022    8:31 AM 06/22/2022    8:06 PM 11/16/2016    3:56 PM  CBC EXTENDED  WBC 4.0 - 10.5 K/uL 9.0  8.9  7.6   RBC 4.22 - 5.81 MIL/uL 3.80  4.38  4.74   Hemoglobin 13.0 - 17.0 g/dL 8.6  9.9  11.3   HCT 39.0 - 52.0 % 28.0  32.3  36.6   Platelets 150 - 400 K/uL 336  391  286   NEUT# 1.7 - 7.7 K/uL  6.7  5.0   Lymph# 0.7 - 4.0 K/uL  1.2  1.9     Neurologic: Patient does not have protective sensation bilateral lower extremities.   MUSCULOSKELETAL:   Skin: Examination patient has a necrotic ulcer with plantar aspect right foot with necrotic tendon and bone.  Patient also has a necrotic ulcer over the fibula right ankle with exposed bone.  Left foot shows ulceration over the fifth metatarsal head base of the fifth metatarsal and a decubitus heel ulcer.  Patient has a knee flexion contracture of both lower extremities at about 30 degrees.  Patient has a faintly palpable popliteal pulse.  Hemoglobin 8.6 with a white cell count of 9.0.  Albumin 3.1 hemoglobin A1c 4.9 with a sed rate of 79 and a C-reactive protein of 10. Assessment: Peripheral vascular disease with paralysis both lower extremities, with knee flexion contracture bilaterally, with osteomyelitis and ulceration of the right hindfoot and right ankle with ulcerations to the left foot.  Plan: Plan: I discussed with patient recommendation to proceed with an above-the-knee amputation on the right lower extremity.  Ankle-brachial indices are pending. Will start wound care for the left lower extremity.  Thank you for the consult and the opportunity to see Jeffrey Fields   , [place] [phone] 10:08 AM

## 2022-06-23 NOTE — Plan of Care (Signed)

## 2022-06-23 NOTE — Consult Note (Addendum)
[facility] Nurse Consult Note: Reason for Consult:WOC nurse consult for sacral and coccygeal pressure injuries, Stage 3.  Dr Sharol Given was consulted for the care of the LE wounds. An above the knee amputation is planned on the right LE. He will oversee topical care for the Left LE. His expertise is appreciated. Wound type:Pressure, shear Pressure Injury POA: Yes Measurement:Per Nursing Flow Sheet today by Bedside RN L. Iona Beard.Sacrum is 3cm x 3cm x 2cm and coccygeal area is 1cm x 1cm x 1cm Wound bed: Per Nursing Flow Sheet, Red and yellow nonviable tissue Drainage (amount, consistency, odor) small, light yellow Periwound:Dry Dressing procedure/placement/frequency: Topical care to the pressure injuries on the sacrum and coccygeal area will be with a daily cleanse and gentle pat dry followed by application of MediHoney, an antimicrobial moisture retentive dressing. This will be topped with dry gauze and secured with a silicone foam dressing. The patient will be turned from side to side to minimize time in the supine position.The left heel will be floated by placement into a Prevalon pressure redistribution heel boot.  [facility] nursing team will not follow, but will remain available to this patient, the nursing and medical teams.  Please re-consult if needed.  Thank you for inviting [place] to participate in this patient's Plan of Care.  Maudie Flakes, MSN, RN, CNS, [place], Serita Grammes, [employer], [employer] phone:  [phone]

## 2022-06-23 NOTE — Consult Note (Addendum)
Reason for Consult:spinal stenosis Referring Physician: [facility]  Jeffrey Fields is an 66 y.o. male.   HPI:  66 year old male who presented to the hospital with lower extremity wounds. He states that he has been a paraplegic for 20+ and utilizing a wheel chair. He denies any upper extremity weakness. Has not noticed any changes in his strength.   Past Medical History:  Diagnosis Date   Diabetes mellitus    Hypertension    Paralysis ([place])    Spinal stenosis     Past Surgical History:  Procedure Laterality Date   BACK SURGERY      No Known Allergies  Social History   Tobacco Use   Smoking status: Never   Smokeless tobacco: Never  Substance Use Topics   Alcohol use: No    History reviewed. No pertinent family history.   Review of Systems  Positive ROS: as above  All other systems have been reviewed and were otherwise negative with the exception of those mentioned in the HPI and as above.  Objective: Vital signs in last 24 hours: Temp:  [97.9 F (36.6 C)-98.6 F (37 C)] 98.6 F (37 C) (09/16 2120) Pulse Rate:  [84-96] 93 (09/16 2120) Resp:  [14-20] 17 (09/16 0750) BP: (136-172)/(84-95) 136/88 (09/16 2120) SpO2:  [100 %] 100 % (09/16 2120) Weight:  [83.1 kg] 83.1 kg (09/16 0145)  General Appearance: Alert, cooperative, no distress, appears stated age Head: Normocephalic, without obvious abnormality, atraumatic Eyes: PERRL, conjunctiva/corneas clear, EOM's intact, fundi benign, both eyes      Lungs: respirations unlabored Heart: Regular rate and rhythm Extremities: Extremities normal, atraumatic, no cyanosis or edema Pulses: 2+ and symmetric all extremities Skin: Skin color, texture, turgor normal, no rashes or lesions  NEUROLOGIC:   Mental status: A&O x4, no aphasia, good attention span, Memory and fund of knowledge Motor Exam -  paraplegic Sensory Exam - decreased sensation in lower extremities Reflexes: hyporeflexia  Coordination - grossly  normal Gait - paraplegic Balance - paraplegic Cranial Nerves: I: smell Not tested  II: visual acuity  OS: na    OD: na  II: visual fields Full to confrontation  II: pupils Equal, round, reactive to light  III,VII: ptosis None  III,IV,VI: extraocular muscles  Full ROM  V: mastication   V: facial light touch sensation    V,VII: corneal reflex    VII: facial muscle function - upper    VII: facial muscle function - lower   VIII: hearing   IX: soft palate elevation    IX,X: gag reflex   XI: trapezius strength    XI: sternocleidomastoid strength   XI: neck flexion strength    XII: tongue strength      Data Review Lab Results  Component Value Date   WBC 9.0 06/23/2022   HGB 8.6 (L) 06/23/2022   HCT 28.0 (L) 06/23/2022   MCV 73.7 (L) 06/23/2022   PLT 336 06/23/2022   Lab Results  Component Value Date   NA 138 06/23/2022   K 3.0 (L) 06/23/2022   CL 104 06/23/2022   CO2 29 06/23/2022   BUN 10 06/23/2022   CREATININE 0.82 06/23/2022   GLUCOSE 85 06/23/2022   Lab Results  Component Value Date   INR 1.1 06/22/2022    Radiology: DG Abd 1 View  Result Date: 06/23/2022 CLINICAL DATA:  Subdiaphragmatic lucency seen on chest x-ray. EXAM: ABDOMEN - 1 VIEW COMPARISON:  Chest x-ray from yesterday. FINDINGS: Mild diffusely distended colon containing air and  stool accounts for the lucency seen on chest x-ray. No definite pneumoperitoneum. No obstruction. No radio-opaque calculi or other significant radiographic abnormality are seen. No acute osseous abnormality. IMPRESSION: 1. No acute findings. Prominent colon distended with air and stool accounts for the findings on chest x-ray. Correlate for constipation. Electronically Signed   By: Titus Dubin M.D.   On: 06/23/2022 09:46   DG Tibia/Fibula Right  Result Date: 06/22/2022 CLINICAL DATA:  Foot wound EXAM: RIGHT TIBIA AND FIBULA - 2 VIEW COMPARISON:  None Available. FINDINGS: No fracture or malalignment. Soft tissue ulcer or wound  at the distal lower leg on the lateral side. No definite underlying periostitis or osseous destructive changes. Generalized soft tissue edema. Advanced arthritis at the knee. IMPRESSION: No definite acute osseous abnormality. Wound or ulcer distal lateral lower leg Electronically Signed   By: Donavan Foil M.D.   On: 06/22/2022 21:33   DG Foot 2 Views Right  Result Date: 06/22/2022 CLINICAL DATA:  Foot wound EXAM: RIGHT FOOT - 2 VIEW COMPARISON:  None Available. FINDINGS: Bones appear demineralized. Large plantar calcaneal spur. Tarsal degenerative change. Moderate arthritis at the first IP and MTP joints. Partial osseous bridging across the second MTP joint. Ulcer lateral aspect of the foot. Erosion at the base of the fifth proximal phalanx. Erosion and osseous destructive change involving the distal shaft and head of the fifth metacarpal. IMPRESSION: 1. Positive for osteomyelitis involving the base of the fifth proximal phalanx and the mid to distal fifth metatarsal with overlying wound Electronically Signed   By: Donavan Foil M.D.   On: 06/22/2022 21:25   DG Foot Complete Left  Result Date: 06/22/2022 CLINICAL DATA:  Foot wounds EXAM: LEFT FOOT - COMPLETE 3+ VIEW COMPARISON:  None Available. FINDINGS: Bones appear demineralized. Degenerative changes at the TMT joints. Ulcer lateral aspect of the foot at the level of fifth MTP joint. Erosive change at the base of the fifth proximal phalanx and head of the fifth metatarsal, consistent with osteomyelitis. Possible pathologic fracture deformity at the base of the fifth proximal phalanx. Moderate degenerative change at the first IP and MTP joints. Small gas in the soft tissues could relate to ulcer or necrotizing infection. IMPRESSION: 1. Soft tissue ulcer lateral aspect of the foot at the level of the fifth MTP joint with evidence for osteomyelitis involving the base of the fifth proximal phalanx and the head of the fifth metatarsal. Small amount of soft  tissue gas could relate to the patient's wound or be due to necrotic infection Electronically Signed   By: Donavan Foil M.D.   On: 06/22/2022 21:23   DG Chest 2 View  Result Date: 06/22/2022 CLINICAL DATA:  Wounds possible sepsis EXAM: CHEST - 2 VIEW COMPARISON:  Chest x-ray 12/01/2021, chest CT 11/26/2021, abdomen radiograph 12/01/2021 FINDINGS: No acute airspace disease or effusion. Stable cardiomediastinal silhouette, right hilar convex opacity felt related to ectatic ascending aorta on chest CT. No pneumothorax. Probable prominent air distended bowel loops in the upper abdomen beneath the diaphragms IMPRESSION: 1. No radiographic evidence for acute cardiopulmonary abnormality 2. Lucency beneath the diaphragms probably due to marked air distension of bowel given appearance on lateral view but recommend dedicated abdominal radiograph and/or CT for further assessment. Electronically Signed   By: Donavan Foil M.D.   On: 06/22/2022 19:53    Assessment/Plan: 66 year old male who presented to the ED with lower extremity wounds. We were called to consult on this patient because of chronic stenosis on his  2005 imaging with no new imagine since then. This patient has been a paraplegic for 20+ and just had an MI in February with stent placement. Given his current wounds and osteomyelitis of his feet as well as his recent MI, there is absolutely no role for neurosurgery at this point. We don't even have recent imaging of his spine?? There is no spine surgery that would fix his paraplegia at this point. Of note, the patient refused spine surgery 20+ years ago for his chronic stenosis and therefore ended up a paraplegic and has no interest in ever having spine surgery according to him. We will sign off of this case.    Ocie Cornfield [facility] 06/23/2022 9:56 PM

## 2022-06-23 NOTE — Plan of Care (Signed)

## 2022-06-23 NOTE — Progress Notes (Signed)
Patient received from [facility] ED with x2 sacral wound and BLE wound. Patient oriented x4, paraplegic.

## 2022-06-24 ENCOUNTER — Inpatient Hospital Stay (HOSPITAL_COMMUNITY): Payer: Medicare HMO

## 2022-06-24 DIAGNOSIS — I96 Gangrene, not elsewhere classified: Secondary | ICD-10-CM | POA: Diagnosis not present

## 2022-06-24 DIAGNOSIS — D649 Anemia, unspecified: Secondary | ICD-10-CM

## 2022-06-24 DIAGNOSIS — M86179 Other acute osteomyelitis, unspecified ankle and foot: Secondary | ICD-10-CM

## 2022-06-24 DIAGNOSIS — L0390 Cellulitis, unspecified: Secondary | ICD-10-CM | POA: Diagnosis not present

## 2022-06-24 DIAGNOSIS — E11622 Type 2 diabetes mellitus with other skin ulcer: Secondary | ICD-10-CM

## 2022-06-24 DIAGNOSIS — L97909 Non-pressure chronic ulcer of unspecified part of unspecified lower leg with unspecified severity: Secondary | ICD-10-CM

## 2022-06-24 DIAGNOSIS — I739 Peripheral vascular disease, unspecified: Secondary | ICD-10-CM | POA: Diagnosis not present

## 2022-06-24 LAB — CBC
HCT: 27.4 % — ABNORMAL LOW (ref 39.0–52.0)
Hemoglobin: 8.5 g/dL — ABNORMAL LOW (ref 13.0–17.0)
MCH: 22.6 pg — ABNORMAL LOW (ref 26.0–34.0)
MCHC: 31 g/dL (ref 30.0–36.0)
MCV: 72.9 fL — ABNORMAL LOW (ref 80.0–100.0)
Platelets: 294 10*3/uL (ref 150–400)
RBC: 3.76 MIL/uL — ABNORMAL LOW (ref 4.22–5.81)
RDW: 16.9 % — ABNORMAL HIGH (ref 11.5–15.5)
WBC: 7.6 10*3/uL (ref 4.0–10.5)
nRBC: 0 % (ref 0.0–0.2)

## 2022-06-24 LAB — RETICULOCYTES
Immature Retic Fract: 17.3 % — ABNORMAL HIGH (ref 2.3–15.9)
RBC.: 3.82 MIL/uL — ABNORMAL LOW (ref 4.22–5.81)
Retic Count, Absolute: 51.6 10*3/uL (ref 19.0–186.0)
Retic Ct Pct: 1.4 % (ref 0.4–3.1)

## 2022-06-24 LAB — BASIC METABOLIC PANEL
Anion gap: 7 (ref 5–15)
BUN: 10 mg/dL (ref 8–23)
CO2: 26 mmol/L (ref 22–32)
Calcium: 8.7 mg/dL — ABNORMAL LOW (ref 8.9–10.3)
Chloride: 103 mmol/L (ref 98–111)
Creatinine, Ser: 0.79 mg/dL (ref 0.61–1.24)
GFR, Estimated: >60 mL/min (ref >60)
Glucose, Bld: 83 mg/dL (ref 70–99)
Potassium: 3.7 mmol/L (ref 3.5–5.1)
Sodium: 136 mmol/L (ref 135–145)

## 2022-06-24 LAB — GLUCOSE, CAPILLARY
Glucose-Capillary: 111 mg/dL — ABNORMAL HIGH (ref 70–99)
Glucose-Capillary: 118 mg/dL — ABNORMAL HIGH (ref 70–99)
Glucose-Capillary: 120 mg/dL — ABNORMAL HIGH (ref 70–99)
Glucose-Capillary: 79 mg/dL (ref 70–99)
Glucose-Capillary: 81 mg/dL (ref 70–99)
Glucose-Capillary: 88 mg/dL (ref 70–99)
Glucose-Capillary: 88 mg/dL (ref 70–99)

## 2022-06-24 LAB — IRON AND TIBC
Iron: 26 ug/dL — ABNORMAL LOW (ref 45–182)
Saturation Ratios: 10 % — ABNORMAL LOW (ref 17.9–39.5)
TIBC: 249 ug/dL — ABNORMAL LOW (ref 250–450)
UIBC: 223 ug/dL

## 2022-06-24 LAB — FERRITIN: Ferritin: 39 ng/mL (ref 24–336)

## 2022-06-24 LAB — VITAMIN B12: Vitamin B-12: 650 pg/mL (ref 180–914)

## 2022-06-24 LAB — MAGNESIUM: Magnesium: 1.8 mg/dL (ref 1.7–2.4)

## 2022-06-24 LAB — FOLATE: Folate: 20.5 ng/mL (ref >5.9)

## 2022-06-24 NOTE — Progress Notes (Signed)
ABI w/ TBI study completed.   Please see CV Proc for preliminary results.    , RDMS, RVT  

## 2022-06-24 NOTE — Plan of Care (Signed)

## 2022-06-24 NOTE — Progress Notes (Addendum)
[facility] PROGRESS NOTE   Jeffrey Fields GMW:[id number] DOB: 31-Jan-1956 DOA: 06/22/2022  PCP: [name], DO  Brief History/Interval Summary:  66 y.o. male with medical history significant of type 2 diabetes, hypertension, hyperlipidemia, paraplegia secondary to spinal stenosis presented to the ED with complaint of bilateral lower extremity/foot wounds.  Imaging studies raised concern for osteomyelitis involving bilateral feet.  Patient was hospitalized.  Orthopedics was consulted.    Consultants: Orthopedics  Procedures: None yet    Subjective/Interval History: Patient denies any chest pain shortness of breath.  No nausea or vomiting.  Denies any significant pain in the lower extremities.      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  No clear evidence for sepsis otherwise. Imaging studies raise concern for osteomyelitis involving the base of the fifth proximal phalanx of the left foot and head of the fifth metatarsal.  Soft tissue gas was also noted.   There was also concern for osteomyelitis of the right fifth proximal phalanx and the mid to distal fifth metatarsal. Seen by orthopedics.  Plan is for right above-knee amputation in the near future.  ABIs are pending. Wound care to the left lower extremity wounds. CRP noted to be 10.0.  ESR 79. Follow-up on blood cultures which are negative so far.  Diabetes mellitus type 2, controlled HbA1c 4.9.  Holding oral agents.  SSI.  CBGs are reasonably well controlled.  Essential hypertension Monitor blood pressures closely.  Hydralazine as needed.  We will reinitiate his home medications.  Hyperlipidemia Noted to be on statin.  Microcytic anemia Mild drop in hemoglobin noted likely dilutional.  No evidence for overt bleeding.  Anemia panel reviewed.  Ferritin 39, TIBC 249, iron 26, percent  saturation 10.  Vitamin Y40 347 folic acid 42.5.  Macrocytosis is noted.  There could be an element of iron deficiency from a component of anemia of chronic disease.  Will need iron supplementation at discharge.  Paraplegia This is secondary to spinal stenosis.  Long history of same, ongoing for 20+ years.  Hypokalemia Repleted.  Magnesium 1.8.  Concern for air under the diaphragm Abdominal films showed that this finding is due to gas in the bowels.  Abdomen is benign on examination.  Bowel regimen. No further work-up at this time.   DVT Prophylaxis: Lovenox Code Status: Full code Family Communication: Discussed with patient Disposition Plan: To be determined.  He does live by himself but he has good support from family.  Status is: Inpatient Remains inpatient appropriate because: Need for IV antibiotics, possible need for surgical intervention      Medications: Scheduled:  amLODipine  10 mg Oral Daily   collagenase   Topical Daily   enoxaparin (LOVENOX) injection  40 mg Subcutaneous Daily   insulin aspart  0-9 Units Subcutaneous Q4H   leptospermum manuka honey  1 Application Topical Daily   lisinopril  10 mg Oral BID   metoprolol tartrate  50 mg Oral BID   [START ON 06/25/2022] rosuvastatin  10 mg Oral Once per day on Mon Thu   Continuous:  ceFEPime (MAXIPIME) IV 2 g (06/24/22 0533)   vancomycin 750 mg (06/24/22 0055)   ZDG:LOVFIEPPIRJJO **OR** acetaminophen, hydrALAZINE, oxyCODONE-acetaminophen  Antibiotics: Anti-infectives (From admission, onward)    Start     Dose/Rate Route Frequency Ordered Stop   06/23/22 1200  vancomycin (VANCOREADY) IVPB 750 mg/150 mL  750 mg 150 mL/hr over 60 Minutes Intravenous Every 12 hours 06/23/22 0528     06/23/22 0615  ceFEPIme (MAXIPIME) 2 g in sodium chloride 0.9 % 100 mL IVPB        2 g 200 mL/hr over 30 Minutes Intravenous Every 8 hours 06/23/22 0526     06/22/22 2145  vancomycin (VANCOREADY) IVPB 2000 mg/400 mL  Status:   Discontinued        2,000 mg 200 mL/hr over 120 Minutes Intravenous  Once 06/22/22 2134 06/22/22 2138   06/22/22 2145  cefTRIAXone (ROCEPHIN) 2 g in sodium chloride 0.9 % 100 mL IVPB        2 g 200 mL/hr over 30 Minutes Intravenous  Once 06/22/22 2134 06/22/22 2330   06/22/22 2145  vancomycin (VANCOCIN) IVPB 1000 mg/200 mL premix       See Hyperspace for full Linked Orders Report.   1,000 mg 200 mL/hr over 60 Minutes Intravenous  Once 06/22/22 2137 06/23/22 0057   06/22/22 2145  vancomycin (VANCOCIN) IVPB 1000 mg/200 mL premix       See Hyperspace for full Linked Orders Report.   1,000 mg 200 mL/hr over 60 Minutes Intravenous  Once 06/22/22 2137 06/23/22 0057       Objective:  Vital Signs  Vitals:   06/23/22 0145 06/23/22 0415 06/23/22 0750 06/23/22 2120  BP: (!) 162/95 (!) 172/87 (!) 156/84 136/88  Pulse: 90 87 96 93  Resp: '16 14 17   ' Temp: 97.9 F (36.6 C) 98.2 F (36.8 C) 98.3 F (36.8 C) 98.6 F (37 C)  TempSrc:  Oral Oral Oral  SpO2: 100% 100% 100% 100%  Weight: 83.1 kg     Height: 6' 3.5" (1.918 m)       Intake/Output Summary (Last 24 hours) at 06/24/2022 0947 Last data filed at 06/23/2022 2000 Gross per 24 hour  Intake 490 ml  Output 3300 ml  Net -2810 ml    Filed Weights   06/23/22 0145  Weight: 83.1 kg    General appearance: Awake alert.  In no distress Resp: Clear to auscultation bilaterally.  Normal effort Cardio: S1-S2 is normal regular.  No S3-S4.  No rubs murmurs or bruit GI: Abdomen is soft.  Nontender nondistended.  Bowel sounds are present normal.  No masses organomegaly Extremities: Lower extremities covered in dressing Neurologic: Alert and oriented x3.  No focal neurological deficits.     Lab Results:  Data Reviewed: I have personally reviewed following labs and reports of the imaging studies  CBC: Recent Labs  Lab 06/22/22 2006 06/23/22 0831 06/24/22 0034  WBC 8.9 9.0 7.6  NEUTROABS 6.7  --   --   HGB 9.9* 8.6* 8.5*  HCT  32.3* 28.0* 27.4*  MCV 73.7* 73.7* 72.9*  PLT 391 336 294     Basic Metabolic Panel: Recent Labs  Lab 06/22/22 2006 06/23/22 0831 06/24/22 0034  NA 136 138 136  K 3.3* 3.0* 3.7  CL 97* 104 103  CO2 '28 29 26  ' GLUCOSE 115* 85 83  BUN '17 10 10  ' CREATININE 0.99 0.82 0.79  CALCIUM 8.8* 8.2* 8.7*  MG  --   --  1.8     GFR: Estimated Creatinine Clearance: 105.3 mL/min (by C-G formula based on SCr of 0.79 mg/dL).  Liver Function Tests: Recent Labs  Lab 06/22/22 2006  AST 18  ALT 14  ALKPHOS 84  BILITOT 0.5  PROT 8.2*  ALBUMIN 3.1*      Coagulation Profile:  Recent Labs  Lab 06/22/22 2006  INR 1.1      HbA1C: Recent Labs    06/23/22 0831  HGBA1C 4.9     CBG: Recent Labs  Lab 06/23/22 1633 06/23/22 2139 06/24/22 0001 06/24/22 0442 06/24/22 0758  GLUCAP 104* 87 88 79 118*      Recent Results (from the past 240 hour(s))  Culture, blood (Routine x 2)     Status: None (Preliminary result)   Collection Time: 06/22/22  8:50 PM   Specimen: BLOOD RIGHT FOREARM  Result Value Ref Range Status   Specimen Description   Final    BLOOD RIGHT FOREARM BLOOD Performed at [facility], [address] [place], [place] [address]    Special Requests   Final    Blood Culture adequate volume BOTTLES DRAWN AEROBIC AND ANAEROBIC Performed at [facility], [address], [place], [place] [address]    Culture   Final    NO GROWTH 2 DAYS Performed at [facility], [place] [address], [place],  [address]    Report Status PENDING  Incomplete  Culture, blood (Routine x 2)     Status: None (Preliminary result)   Collection Time: 06/22/22  8:59 PM   Specimen: Right Antecubital; Blood  Result Value Ref Range Status   Specimen Description   Final    RIGHT ANTECUBITAL BLOOD Performed at [facility], [address] [place], [place] [address]    Special Requests   Final    Blood Culture adequate volume BOTTLES DRAWN  AEROBIC AND ANAEROBIC Performed at [facility], [address] [place], [place] [address]    Culture   Final    NO GROWTH 2 DAYS Performed at [facility], [place] [address] [place],  [address]    Report Status PENDING  Incomplete      Radiology Studies: DG Abd 1 View  Result Date: 06/23/2022 CLINICAL DATA:  Subdiaphragmatic lucency seen on chest x-ray. EXAM: ABDOMEN - 1 VIEW COMPARISON:  Chest x-ray from yesterday. FINDINGS: Mild diffusely distended colon containing air and stool accounts for the lucency seen on chest x-ray. No definite pneumoperitoneum. No obstruction. No radio-opaque calculi or other significant radiographic abnormality are seen. No acute osseous abnormality. IMPRESSION: 1. No acute findings. Prominent colon distended with air and stool accounts for the findings on chest x-ray. Correlate for constipation. Electronically Signed   By: Titus Dubin M.D.   On: 06/23/2022 09:46   DG Tibia/Fibula Right  Result Date: 06/22/2022 CLINICAL DATA:  Foot wound EXAM: RIGHT TIBIA AND FIBULA - 2 VIEW COMPARISON:  None Available. FINDINGS: No fracture or malalignment. Soft tissue ulcer or wound at the distal lower leg on the lateral side. No definite underlying periostitis or osseous destructive changes. Generalized soft tissue edema. Advanced arthritis at the knee. IMPRESSION: No definite acute osseous abnormality. Wound or ulcer distal lateral lower leg Electronically Signed   By: Donavan Foil M.D.   On: 06/22/2022 21:33   DG Foot 2 Views Right  Result Date: 06/22/2022 CLINICAL DATA:  Foot wound EXAM: RIGHT FOOT - 2 VIEW COMPARISON:  None Available. FINDINGS: Bones appear demineralized. Large plantar calcaneal spur. Tarsal degenerative change. Moderate arthritis at the first IP and MTP joints. Partial osseous bridging across the second MTP joint. Ulcer lateral aspect of the foot. Erosion at the base of the fifth proximal phalanx. Erosion and osseous destructive  change involving the distal shaft  and head of the fifth metacarpal. IMPRESSION: 1. Positive for osteomyelitis involving the base of the fifth proximal phalanx and the mid to distal fifth metatarsal with overlying wound Electronically Signed   By: Donavan Foil M.D.   On: 06/22/2022 21:25   DG Foot Complete Left  Result Date: 06/22/2022 CLINICAL DATA:  Foot wounds EXAM: LEFT FOOT - COMPLETE 3+ VIEW COMPARISON:  None Available. FINDINGS: Bones appear demineralized. Degenerative changes at the TMT joints. Ulcer lateral aspect of the foot at the level of fifth MTP joint. Erosive change at the base of the fifth proximal phalanx and head of the fifth metatarsal, consistent with osteomyelitis. Possible pathologic fracture deformity at the base of the fifth proximal phalanx. Moderate degenerative change at the first IP and MTP joints. Small gas in the soft tissues could relate to ulcer or necrotizing infection. IMPRESSION: 1. Soft tissue ulcer lateral aspect of the foot at the level of the fifth MTP joint with evidence for osteomyelitis involving the base of the fifth proximal phalanx and the head of the fifth metatarsal. Small amount of soft tissue gas could relate to the patient's wound or be due to necrotic infection Electronically Signed   By: Donavan Foil M.D.   On: 06/22/2022 21:23   DG Chest 2 View  Result Date: 06/22/2022 CLINICAL DATA:  Wounds possible sepsis EXAM: CHEST - 2 VIEW COMPARISON:  Chest x-ray 12/01/2021, chest CT 11/26/2021, abdomen radiograph 12/01/2021 FINDINGS: No acute airspace disease or effusion. Stable cardiomediastinal silhouette, right hilar convex opacity felt related to ectatic ascending aorta on chest CT. No pneumothorax. Probable prominent air distended bowel loops in the upper abdomen beneath the diaphragms IMPRESSION: 1. No radiographic evidence for acute cardiopulmonary abnormality 2. Lucency beneath the diaphragms probably due to marked air distension of bowel given appearance  on lateral view but recommend dedicated abdominal radiograph and/or CT for further assessment. Electronically Signed   By: Donavan Foil M.D.   On: 06/22/2022 19:53       LOS: 1 day   [facility] Pager on [email or web address]  06/24/2022, 9:47 AM

## 2022-06-24 NOTE — Plan of Care (Signed)

## 2022-06-25 DIAGNOSIS — M86179 Other acute osteomyelitis, unspecified ankle and foot: Secondary | ICD-10-CM | POA: Diagnosis not present

## 2022-06-25 DIAGNOSIS — E11622 Type 2 diabetes mellitus with other skin ulcer: Secondary | ICD-10-CM | POA: Diagnosis not present

## 2022-06-25 DIAGNOSIS — L97909 Non-pressure chronic ulcer of unspecified part of unspecified lower leg with unspecified severity: Secondary | ICD-10-CM | POA: Diagnosis not present

## 2022-06-25 DIAGNOSIS — D649 Anemia, unspecified: Secondary | ICD-10-CM | POA: Diagnosis not present

## 2022-06-25 LAB — GLUCOSE, CAPILLARY
Glucose-Capillary: 142 mg/dL — ABNORMAL HIGH (ref 70–99)
Glucose-Capillary: 65 mg/dL — ABNORMAL LOW (ref 70–99)
Glucose-Capillary: 69 mg/dL — ABNORMAL LOW (ref 70–99)
Glucose-Capillary: 82 mg/dL (ref 70–99)
Glucose-Capillary: 85 mg/dL (ref 70–99)
Glucose-Capillary: 86 mg/dL (ref 70–99)

## 2022-06-25 LAB — VANCOMYCIN, TROUGH: Vancomycin Tr: 13 ug/mL — ABNORMAL LOW (ref 15–20)

## 2022-06-25 LAB — VANCOMYCIN, PEAK: Vancomycin Pk: 19 ug/mL — ABNORMAL LOW (ref 30–40)

## 2022-06-25 MED ORDER — SODIUM CHLORIDE 0.9 % IV SOLN
2.0000 g | Freq: Three times a day (TID) | INTRAVENOUS | Status: DC
  Administered 2022-06-26 – 2022-06-28 (×7): 2 g via INTRAVENOUS
  Filled 2022-06-25 (×6): qty 12.5

## 2022-06-25 NOTE — Plan of Care (Signed)
  Problem: Education: Goal: Knowledge of General Education information will improve Description: Including pain rating scale, medication(s)/side effects and non-pharmacologic comfort measures Outcome: Progressing   Problem: Clinical Measurements: Goal: Will remain free from infection Outcome: Progressing   Problem: Activity: Goal: Risk for activity intolerance will decrease Outcome: Progressing   Problem: Nutrition: Goal: Adequate nutrition will be maintained Outcome: Progressing   Problem: Safety: Goal: Ability to remain free from injury will improve Outcome: Progressing   Problem: Skin Integrity: Goal: Risk for impaired skin integrity will decrease Outcome: Progressing   

## 2022-06-25 NOTE — Plan of Care (Signed)

## 2022-06-25 NOTE — Progress Notes (Signed)
Pharmacy Antibiotic Note  Jeffrey Fields is a 66 y.o. male admitted on 06/22/2022 with cellulitis.  Pharmacy has been consulted for Vanc and cefepime dosing.  Vanc peak 19, vanc trough 13>>AUC 449 Scr 0.79 Plan for AKA soon  Plan: Vanc 750 q12hr  Cefepime 2 gms IV q8hr Monitor renal function, clinical status, C&S and vanc levels as needed  Height: 6' 3.5" (191.8 cm) Weight: 83.1 kg (183 lb 3.2 oz) IBW/kg (Calculated) : 85.65  Temp (24hrs), Avg:98.4 F (36.9 C), Min:98 F (36.7 C), Max:98.8 F (37.1 C)  Recent Labs  Lab 06/22/22 2006 06/23/22 0038 06/23/22 0831 06/24/22 0034 06/25/22 1050 06/25/22 1749  WBC 8.9  --  9.0 7.6  --   --   CREATININE 0.99  --  0.82 0.79  --   --   LATICACIDVEN 2.3* 2.0*  --   --   --   --   VANCOTROUGH  --   --   --   --   --  13*  VANCOPEAK  --   --   --   --  19*  --      Estimated Creatinine Clearance: 105.3 mL/min (by C-G formula based on SCr of 0.79 mg/dL).    No Known Allergies  Antimicrobials this admission: Cefepime 9/16 >>  Vanc 9/16 >>    Onnie Boer, PharmD, [place], AAHIVP, CPP Infectious Disease Pharmacist 06/25/2022 6:55 PM

## 2022-06-25 NOTE — Progress Notes (Signed)
[facility] PROGRESS NOTE   Jeffrey M Martinique SMO:[id number] DOB: June 13, 1956 DOA: 06/22/2022  PCP: [name], DO  Brief History/Interval Summary:  66 y.o. male with medical history significant of type 2 diabetes, hypertension, hyperlipidemia, paraplegia secondary to spinal stenosis presented to the ED with complaint of bilateral lower extremity/foot wounds.  Imaging studies raised concern for osteomyelitis involving bilateral feet.  Patient was hospitalized.  Orthopedics was consulted.    Consultants: Orthopedics  Procedures: None yet    Subjective/Interval History: Patient feels well.  Pain in the legs is reasonably well controlled.  Denies any nausea or vomiting.  No shortness of breath.      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  No clear evidence for sepsis otherwise. Imaging studies raise concern for osteomyelitis involving the base of the fifth proximal phalanx of the left foot and head of the fifth metatarsal.  Soft tissue gas was also noted.   There was also concern for osteomyelitis of the right fifth proximal phalanx and the mid to distal fifth metatarsal. Seen by orthopedics.  Plan is for right above-knee amputation in the near future.  ABIs suggest moderate disease in the right lower extremity.  Further plans per orthopedics. Wound care to the left lower extremity wounds. CRP noted to be 10.0.  ESR 79. Follow-up on blood cultures which are negative so far. Ordered labs for tomorrow morning.  Diabetes mellitus type 2, controlled HbA1c 4.9.  Holding oral agents.  SSI.  CBGs are reasonably well controlled.  Essential hypertension Currently noted to be on amlodipine lisinopril and metoprolol.  Blood pressure reasonably well controlled.  Will not be too aggressive with blood pressure control at this time.  Hyperlipidemia Noted to  be on statin.  Microcytic anemia Mild drop in hemoglobin noted likely dilutional.  No evidence for overt bleeding.  Anemia panel reviewed.  Ferritin 39, TIBC 249, iron 26, percent saturation 10.  Vitamin B20 100 folic acid 71.2.  Macrocytosis is noted.  There could be an element of iron deficiency from a component of anemia of chronic disease.  Will need iron supplementation at discharge.  May need further work-up for anemia in the outpatient setting.  Paraplegia This is secondary to spinal stenosis.  Long history of same, ongoing for 20+ years.  Hypokalemia Repleted.  Magnesium 1.8.  Concern for air under the diaphragm Abdominal films showed that this finding is due to gas in the bowels.  Abdomen is benign on examination.  Bowel regimen. No further work-up at this time.   DVT Prophylaxis: Lovenox Code Status: Full code Family Communication: Discussed with patient Disposition Plan: To be determined.  He does live by himself but he has good support from family.  Status is: Inpatient Remains inpatient appropriate because: Need for IV antibiotics, possible need for surgical intervention      Medications: Scheduled:  amLODipine  10 mg Oral Daily   collagenase   Topical Daily   enoxaparin (LOVENOX) injection  40 mg Subcutaneous Daily   insulin aspart  0-9 Units Subcutaneous Q4H   leptospermum manuka honey  1 Application Topical Daily   lisinopril  10 mg Oral BID   metoprolol tartrate  50 mg Oral BID   rosuvastatin  10 mg Oral Once per day on Mon Thu   Continuous:  ceFEPime (MAXIPIME) IV 2 g (06/25/22 1975)   vancomycin 750 mg (06/25/22 0457)   OIT:GPQDIYMEBRAXE **OR** acetaminophen,  hydrALAZINE, oxyCODONE-acetaminophen  Antibiotics: Anti-infectives (From admission, onward)    Start     Dose/Rate Route Frequency Ordered Stop   06/23/22 1200  vancomycin (VANCOREADY) IVPB 750 mg/150 mL        750 mg 150 mL/hr over 60 Minutes Intravenous Every 12 hours 06/23/22 0528      06/23/22 0615  ceFEPIme (MAXIPIME) 2 g in sodium chloride 0.9 % 100 mL IVPB        2 g 200 mL/hr over 30 Minutes Intravenous Every 8 hours 06/23/22 0526     06/22/22 2145  vancomycin (VANCOREADY) IVPB 2000 mg/400 mL  Status:  Discontinued        2,000 mg 200 mL/hr over 120 Minutes Intravenous  Once 06/22/22 2134 06/22/22 2138   06/22/22 2145  cefTRIAXone (ROCEPHIN) 2 g in sodium chloride 0.9 % 100 mL IVPB        2 g 200 mL/hr over 30 Minutes Intravenous  Once 06/22/22 2134 06/22/22 2330   06/22/22 2145  vancomycin (VANCOCIN) IVPB 1000 mg/200 mL premix       See Hyperspace for full Linked Orders Report.   1,000 mg 200 mL/hr over 60 Minutes Intravenous  Once 06/22/22 2137 06/23/22 0057   06/22/22 2145  vancomycin (VANCOCIN) IVPB 1000 mg/200 mL premix       See Hyperspace for full Linked Orders Report.   1,000 mg 200 mL/hr over 60 Minutes Intravenous  Once 06/22/22 2137 06/23/22 0057       Objective:  Vital Signs  Vitals:   06/24/22 2021 06/25/22 0424 06/25/22 0528 06/25/22 0731  BP: (!) 141/86 (!) 158/91 (!) 140/71   Pulse: 98 (!) 105    Resp: 15 (!) 22 19   Temp: 98.3 F (36.8 C) 98.8 F (37.1 C)  98.3 F (36.8 C)  TempSrc: Oral   Oral  SpO2: 100% 100%  100%  Weight:      Height:        Intake/Output Summary (Last 24 hours) at 06/25/2022 1033 Last data filed at 06/25/2022 8127 Gross per 24 hour  Intake 940 ml  Output 2220 ml  Net -1280 ml    Filed Weights   06/23/22 0145  Weight: 83.1 kg    General appearance: Awake alert.  In no distress Resp: Clear to auscultation bilaterally.  Normal effort Cardio: S1-S2 is normal regular.  No S3-S4.  No rubs murmurs or bruit GI: Abdomen is soft.  Nontender nondistended.  Bowel sounds are present normal.  No masses organomegaly Extremities: Both lower extremities covered in dressing Neurologic: Alert and oriented x3.  No focal neurological deficits.      Lab Results:  Data Reviewed: I have personally reviewed following  labs and reports of the imaging studies  CBC: Recent Labs  Lab 06/22/22 2006 06/23/22 0831 06/24/22 0034  WBC 8.9 9.0 7.6  NEUTROABS 6.7  --   --   HGB 9.9* 8.6* 8.5*  HCT 32.3* 28.0* 27.4*  MCV 73.7* 73.7* 72.9*  PLT 391 336 294     Basic Metabolic Panel: Recent Labs  Lab 06/22/22 2006 06/23/22 0831 06/24/22 0034  NA 136 138 136  K 3.3* 3.0* 3.7  CL 97* 104 103  CO2 '28 29 26  ' GLUCOSE 115* 85 83  BUN '17 10 10  ' CREATININE 0.99 0.82 0.79  CALCIUM 8.8* 8.2* 8.7*  MG  --   --  1.8     GFR: Estimated Creatinine Clearance: 105.3 mL/min (by C-G formula based on SCr of 0.79  mg/dL).  Liver Function Tests: Recent Labs  Lab 06/22/22 2006  AST 18  ALT 14  ALKPHOS 84  BILITOT 0.5  PROT 8.2*  ALBUMIN 3.1*      Coagulation Profile: Recent Labs  Lab 06/22/22 2006  INR 1.1      HbA1C: Recent Labs    06/23/22 0831  HGBA1C 4.9     CBG: Recent Labs  Lab 06/24/22 1627 06/24/22 2021 06/24/22 2352 06/25/22 0421 06/25/22 0730  GLUCAP 111* 120* 88 86 82      Recent Results (from the past 240 hour(s))  Culture, blood (Routine x 2)     Status: None (Preliminary result)   Collection Time: 06/22/22  8:50 PM   Specimen: BLOOD RIGHT FOREARM  Result Value Ref Range Status   Specimen Description   Final    BLOOD RIGHT FOREARM BLOOD Performed at [facility], [address] [place], [place] [address]    Special Requests   Final    Blood Culture adequate volume BOTTLES DRAWN AEROBIC AND ANAEROBIC Performed at [facility], [address], [place], [place] [address]    Culture   Final    NO GROWTH 3 DAYS Performed at [facility], [place] [address] [place], [place] [address]    Report Status PENDING  Incomplete  Culture, blood (Routine x 2)     Status: None (Preliminary result)   Collection Time: 06/22/22  8:59 PM   Specimen: Right Antecubital; Blood  Result Value Ref Range Status   Specimen Description   Final    RIGHT  ANTECUBITAL BLOOD Performed at [facility], [address] [place], [place] [address]    Special Requests   Final    Blood Culture adequate volume BOTTLES DRAWN AEROBIC AND ANAEROBIC Performed at [facility], [address], [place], [place] [address]    Culture   Final    NO GROWTH 3 DAYS Performed at [facility], [place] [address], [place], [place] [address]    Report Status PENDING  Incomplete      Radiology Studies: VAS [place] ABI WITH/WO TBI  Result Date: 06/24/2022  LOWER EXTREMITY DOPPLER STUDY Patient Name:  Eoghan M Martinique  Date of Exam:   06/24/2022 Medical Rec #: [id number]         Accession #:    [id number] Date of Birth: 1955/11/21         Patient Gender: M Patient Age:   10 years Exam Location:  [facility] Procedure:      VAS [place] ABI WITH/WO TBI Referring Phys: Wandra Feinstein RATHORE --------------------------------------------------------------------------------  Indications: Ulceration, gangrene, and peripheral artery disease. High Risk Factors: Hypertension, hyperlipidemia, Diabetes. Other Factors: Osteomyelitis.  Comparison Study: No prior studies. Performing Technologist: Darlin Coco RDMS RVT  Examination Guidelines: A complete evaluation includes at minimum, Doppler waveform signals and systolic blood pressure reading at the level of bilateral brachial, anterior tibial, and posterior tibial arteries, when vessel segments are accessible. Bilateral testing is considered an integral part of a complete examination. Photoelectric Plethysmograph (PPG) waveforms and toe systolic pressure readings are included as required and additional duplex testing as needed. Limited examinations for reoccurring indications may be performed as noted.  ABI Findings: +---------+------------------+-----+-------------------+-----------------------+ Right    Rt Pressure (mmHg)IndexWaveform           Comment                  +---------+------------------+-----+-------------------+-----------------------+ Brachial  153                    triphasic                                  +---------+------------------+-----+-------------------+-----------------------+ PTA      101               0.66 dampened monophasicLikely falsely elevated                                                    in setting of arterial                                                     calcification           +---------+------------------+-----+-------------------+-----------------------+ DP       104               0.68 dampened monophasicLikely falsely elevated                                                    in setting of arterial                                                     calcification           +---------+------------------+-----+-------------------+-----------------------+ Great Toe0                 0.00 Absent                                     +---------+------------------+-----+-------------------+-----------------------+ +---------+------------------+-----+-------------------+-----------------------+ Left     Lt Pressure (mmHg)IndexWaveform           Comment                 +---------+------------------+-----+-------------------+-----------------------+ Brachial 150                    triphasic                                  +---------+------------------+-----+-------------------+-----------------------+ PTA      0                 0.00 absent                                     +---------+------------------+-----+-------------------+-----------------------+ DP       146               0.95 dampened monophasicLikely falsely elevated  in setting of arterial                                                     calcification           +---------+------------------+-----+-------------------+-----------------------+  Great Toe75                0.49 Abnormal                                   +---------+------------------+-----+-------------------+-----------------------+  Arterial wall calcification precludes accurate ankle pressures and ABIs.  Summary: Right: Resting right ankle-brachial index indicates moderate right lower extremity arterial disease. However, dampened monophasic Doppler waveforms suggest falsely elevated pressures. The right toe waveform is absent. Left: Although ankle brachial indices are within normal limits (0.95-1.29), arterial Doppler waveforms at the ankle suggest some component of arterial occlusive disease. The left toe-brachial index is abnormal.  *See table(s) above for measurements and observations.     Preliminary        LOS: 2 days   [facility] Pager on [email or web address]  06/25/2022, 10:33 AM

## 2022-06-26 ENCOUNTER — Inpatient Hospital Stay (HOSPITAL_COMMUNITY): Payer: Medicare HMO

## 2022-06-26 DIAGNOSIS — E11622 Type 2 diabetes mellitus with other skin ulcer: Secondary | ICD-10-CM | POA: Diagnosis not present

## 2022-06-26 DIAGNOSIS — Z0189 Encounter for other specified special examinations: Secondary | ICD-10-CM

## 2022-06-26 DIAGNOSIS — M86179 Other acute osteomyelitis, unspecified ankle and foot: Secondary | ICD-10-CM | POA: Diagnosis not present

## 2022-06-26 DIAGNOSIS — D649 Anemia, unspecified: Secondary | ICD-10-CM | POA: Diagnosis not present

## 2022-06-26 DIAGNOSIS — L97909 Non-pressure chronic ulcer of unspecified part of unspecified lower leg with unspecified severity: Secondary | ICD-10-CM | POA: Diagnosis not present

## 2022-06-26 LAB — BASIC METABOLIC PANEL
Anion gap: 6 (ref 5–15)
BUN: 10 mg/dL (ref 8–23)
CO2: 25 mmol/L (ref 22–32)
Calcium: 8.9 mg/dL (ref 8.9–10.3)
Chloride: 106 mmol/L (ref 98–111)
Creatinine, Ser: 0.79 mg/dL (ref 0.61–1.24)
GFR, Estimated: >60 mL/min (ref >60)
Glucose, Bld: 75 mg/dL (ref 70–99)
Potassium: 3.4 mmol/L — ABNORMAL LOW (ref 3.5–5.1)
Sodium: 137 mmol/L (ref 135–145)

## 2022-06-26 LAB — CBC
HCT: 26.2 % — ABNORMAL LOW (ref 39.0–52.0)
Hemoglobin: 8.3 g/dL — ABNORMAL LOW (ref 13.0–17.0)
MCH: 23.1 pg — ABNORMAL LOW (ref 26.0–34.0)
MCHC: 31.7 g/dL (ref 30.0–36.0)
MCV: 72.8 fL — ABNORMAL LOW (ref 80.0–100.0)
Platelets: 309 10*3/uL (ref 150–400)
RBC: 3.6 MIL/uL — ABNORMAL LOW (ref 4.22–5.81)
RDW: 17 % — ABNORMAL HIGH (ref 11.5–15.5)
WBC: 8.4 10*3/uL (ref 4.0–10.5)
nRBC: 0 % (ref 0.0–0.2)

## 2022-06-26 LAB — SURGICAL PCR SCREEN
MRSA, PCR: NEGATIVE
Staphylococcus aureus: NEGATIVE

## 2022-06-26 LAB — GLUCOSE, CAPILLARY
Glucose-Capillary: 82 mg/dL (ref 70–99)
Glucose-Capillary: 68 mg/dL — ABNORMAL LOW (ref 70–99)
Glucose-Capillary: 105 mg/dL — ABNORMAL HIGH (ref 70–99)
Glucose-Capillary: 82 mg/dL (ref 70–99)
Glucose-Capillary: 130 mg/dL — ABNORMAL HIGH (ref 70–99)

## 2022-06-26 MED ORDER — DEXTROSE-NACL 5-0.45 % IV SOLN: INTRAVENOUS | Status: DC

## 2022-06-26 MED ORDER — POTASSIUM CHLORIDE CRYS ER 20 MEQ PO TBCR
40.0000 meq | EXTENDED_RELEASE_TABLET | Freq: Once | ORAL | Status: AC
  Administered 2022-06-26: 40 meq via ORAL
  Filled 2022-06-26: qty 2

## 2022-06-26 NOTE — Progress Notes (Signed)
  Echocardiogram 2D Echocardiogram has been performed.  Jeffrey Fields 06/26/2022, 4:55 PM

## 2022-06-26 NOTE — Plan of Care (Signed)
  Problem: Education: Goal: Knowledge of General Education information will improve Description: Including pain rating scale, medication(s)/side effects and non-pharmacologic comfort measures Outcome: Progressing   Problem: Clinical Measurements: Goal: Ability to maintain clinical measurements within normal limits will improve Outcome: Progressing Goal: Diagnostic test results will improve Outcome: Progressing   

## 2022-06-26 NOTE — Plan of Care (Signed)

## 2022-06-26 NOTE — Progress Notes (Addendum)
[facility] PROGRESS NOTE   Jeffrey Fields QMV:[id number] DOB: 1956-02-25 DOA: 06/22/2022  PCP: [name], DO  Brief History/Interval Summary:  66 y.o. male with medical history significant of type 2 diabetes, hypertension, hyperlipidemia, paraplegia secondary to spinal stenosis presented to the ED with complaint of bilateral lower extremity/foot wounds.  Imaging studies raised concern for osteomyelitis involving bilateral feet.  Patient was hospitalized.  Orthopedics was consulted.    Consultants: Orthopedics  Procedures: None yet    Subjective/Interval History: Patient feels well.  Denies any complaints.  No nausea vomiting.  Pain is reasonably well controlled.  Patient mentioned that he was admitted in [facility] earlier this year for "heart issues" No previous mention of this. Care everywhere was subsequently reviewed. See below.   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  No clear evidence for sepsis otherwise. Imaging studies raise concern for osteomyelitis involving the base of the fifth proximal phalanx of the left foot and head of the fifth metatarsal.  Soft tissue gas was also noted.   There was also concern for osteomyelitis of the right fifth proximal phalanx and the mid to distal fifth metatarsal. Seen by orthopedics.  Plan is for right above-knee amputation in the near future.  ABIs suggest moderate disease in the right lower extremity.  Further plans per orthopedics. Wound care to the left lower extremity wounds. CRP noted to be 10.0.  ESR 79.  WBC normal.  Blood cultures negative so far.  Waiting on orthopedic input regarding timing of surgery.  Wound care to left leg.  Coronary artery disease Patient apparently sustained a non-STEMI in February.  This information was not available to [place] until today.  He was admitted at [facility].  Underwent PCI of LAD and left main and with 3 stents to LAD .  Placed on dual antiplatelets with aspirin and Plavix. It does not appear that patient has followed up with cardiology since then. Medication list does not list any antiplatelet agents.  We will resume Plavix after he undergoes surgery tomorrow. Cardiac status seems to be stable. Based on the [place] Perioperative Risk Index the patient's estimated risk probability for perioperative myocardial infarction or cardiac arrest is 1.75%.   Ischemic cardiomyopathy/chronic systolic CHF Initially found to have a EF of 20 to 25% based on echocardiogram done at [facility] regional on 2/20.  Subsequent echocardiogram a few days later showed EF to be 25 to 30%..  This information was obtained from [place].  At the time of discharge he was discharged on metoprolol lisinopril and Aldactone.  Currently on metoprolol lisinopril.  His [place]ade is on hold. Volume status seems to be stable. Would benefit from repeat echocardiogram.  Paroxysmal atrial fibrillation Was on amiodarone infusion at the time of discharge from [facility] in February.  It does not appear the patient was ever started on anticoagulation.  EKG from the current admission shows sinus rhythm.  Diabetes mellitus type 2, controlled HbA1c 4.9.  Holding oral agents.  SSI.  Occasional low glucose levels noted.  We will start him on D5 infusion at low rate.  Essential hypertension Currently noted to be on amlodipine lisinopril and metoprolol.  Blood pressure reasonably well controlled.  Will not be too aggressive with blood pressure control at this time.  Hyperlipidemia Noted to be on statin.  Microcytic anemia Mild drop in hemoglobin noted likely dilutional.  No evidence  for overt bleeding.  Anemia panel reviewed.  Ferritin 39, TIBC 249, iron 26, percent saturation 10.  Vitamin F75 883 folic acid 25.4.  Macrocytosis is noted.  There could be an element of iron deficiency  from a component of anemia of chronic disease.  Will need iron supplementation at discharge.  May need further work-up for anemia in the outpatient setting.  Paraplegia This is secondary to spinal stenosis.  Long history of same, ongoing for 20+ years.  Hypokalemia Will be repleted.  Magnesium 1.8.  Concern for air under the diaphragm Abdominal films showed that this finding is due to gas in the bowels.  Abdomen is benign on examination.  Bowel regimen. No further work-up at this time.   DVT Prophylaxis: Lovenox Code Status: Full code Family Communication: Discussed with patient Disposition Plan: To be determined.  He does live by himself but he has good support from family.  Status is: Inpatient Remains inpatient appropriate because: Need for IV antibiotics, possible need for surgical intervention      Medications: Scheduled:  amLODipine  10 mg Oral Daily   collagenase   Topical Daily   enoxaparin (LOVENOX) injection  40 mg Subcutaneous Daily   insulin aspart  0-9 Units Subcutaneous Q4H   leptospermum manuka honey  1 Application Topical Daily   lisinopril  10 mg Oral BID   metoprolol tartrate  50 mg Oral BID   rosuvastatin  10 mg Oral Once per day on Mon Thu   Continuous:  ceFEPime (MAXIPIME) IV 2 g (06/26/22 0850)   vancomycin 750 mg (06/26/22 0558)   DIY:MEBRAXENMMHWK **OR** acetaminophen, hydrALAZINE, oxyCODONE-acetaminophen  Antibiotics: Anti-infectives (From admission, onward)    Start     Dose/Rate Route Frequency Ordered Stop   06/26/22 0000  ceFEPIme (MAXIPIME) 2 g in sodium chloride 0.9 % 100 mL IVPB        2 g 200 mL/hr over 30 Minutes Intravenous Every 8 hours 06/25/22 2354     06/23/22 1200  vancomycin (VANCOREADY) IVPB 750 mg/150 mL        750 mg 150 mL/hr over 60 Minutes Intravenous Every 12 hours 06/23/22 0528     06/23/22 0615  ceFEPIme (MAXIPIME) 2 g in sodium chloride 0.9 % 100 mL IVPB  Status:  Discontinued        2 g 200 mL/hr over 30 Minutes  Intravenous Every 8 hours 06/23/22 0526 06/25/22 2354   06/22/22 2145  vancomycin (VANCOREADY) IVPB 2000 mg/400 mL  Status:  Discontinued        2,000 mg 200 mL/hr over 120 Minutes Intravenous  Once 06/22/22 2134 06/22/22 2138   06/22/22 2145  cefTRIAXone (ROCEPHIN) 2 g in sodium chloride 0.9 % 100 mL IVPB        2 g 200 mL/hr over 30 Minutes Intravenous  Once 06/22/22 2134 06/22/22 2330   06/22/22 2145  vancomycin (VANCOCIN) IVPB 1000 mg/200 mL premix       See Hyperspace for full Linked Orders Report.   1,000 mg 200 mL/hr over 60 Minutes Intravenous  Once 06/22/22 2137 06/23/22 0057   06/22/22 2145  vancomycin (VANCOCIN) IVPB 1000 mg/200 mL premix       See Hyperspace for full Linked Orders Report.   1,000 mg 200 mL/hr over 60 Minutes Intravenous  Once 06/22/22 2137 06/23/22 0057       Objective:  Vital Signs  Vitals:   06/25/22 1511 06/25/22 2044 06/26/22 0555 06/26/22 0811  BP:  (!) 146/79 135/75 (!) 159/88  Pulse: 95 94 83 83  Resp:  '17 16 18  ' Temp: 98 F (36.7 C) 98.1 F (36.7 C)  98.1 F (36.7 C)  TempSrc: Oral     SpO2:  100% 100% 100%  Weight:      Height:        Intake/Output Summary (Last 24 hours) at 06/26/2022 1124 Last data filed at 06/26/2022 0602 Gross per 24 hour  Intake 648.74 ml  Output 1200 ml  Net -551.26 ml    Filed Weights   06/23/22 0145  Weight: 83.1 kg    General appearance: Awake alert.  In no distress Resp: Clear to auscultation bilaterally.  Normal effort Cardio: S1-S2 is normal regular.  No S3-S4.  No rubs murmurs or bruit GI: Abdomen is soft.  Nontender nondistended.  Bowel sounds are present normal.  No masses organomegaly Extremities: Dressing noted both lower extremities      Lab Results:  Data Reviewed: I have personally reviewed following labs and reports of the imaging studies  CBC: Recent Labs  Lab 06/22/22 2006 06/23/22 0831 06/24/22 0034 06/26/22 0123  WBC 8.9 9.0 7.6 8.4  NEUTROABS 6.7  --   --   --   HGB  9.9* 8.6* 8.5* 8.3*  HCT 32.3* 28.0* 27.4* 26.2*  MCV 73.7* 73.7* 72.9* 72.8*  PLT 391 336 294 309     Basic Metabolic Panel: Recent Labs  Lab 06/22/22 2006 06/23/22 0831 06/24/22 0034 06/26/22 0123  NA 136 138 136 137  K 3.3* 3.0* 3.7 3.4*  CL 97* 104 103 106  CO2 '28 29 26 25  ' GLUCOSE 115* 85 83 75  BUN '17 10 10 10  ' CREATININE 0.99 0.82 0.79 0.79  CALCIUM 8.8* 8.2* 8.7* 8.9  MG  --   --  1.8  --      GFR: Estimated Creatinine Clearance: 105.3 mL/min (by C-G formula based on SCr of 0.79 mg/dL).  Liver Function Tests: Recent Labs  Lab 06/22/22 2006  AST 18  ALT 14  ALKPHOS 84  BILITOT 0.5  PROT 8.2*  ALBUMIN 3.1*      Coagulation Profile: Recent Labs  Lab 06/22/22 2006  INR 1.1       CBG: Recent Labs  Lab 06/25/22 1636 06/25/22 2027 06/25/22 2358 06/26/22 0522 06/26/22 0811  GLUCAP 65* 142* 69* 82 68*      Recent Results (from the past 240 hour(s))  Culture, blood (Routine x 2)     Status: None (Preliminary result)   Collection Time: 06/22/22  8:50 PM   Specimen: BLOOD RIGHT FOREARM  Result Value Ref Range Status   Specimen Description   Final    BLOOD RIGHT FOREARM BLOOD Performed at [facility], [address] [place], [place] [address]    Special Requests   Final    Blood Culture adequate volume BOTTLES DRAWN AEROBIC AND ANAEROBIC Performed at [facility], [address] [place], [place] [address]    Culture   Final    NO GROWTH 4 DAYS Performed at [facility], [place] [address] [place], [place] [address]    Report Status PENDING  Incomplete  Culture, blood (Routine x 2)     Status: None (Preliminary result)   Collection Time: 06/22/22  8:59 PM   Specimen: Right Antecubital; Blood  Result Value Ref Range Status   Specimen Description   Final    RIGHT ANTECUBITAL BLOOD Performed at [facility], [address] [place], [place] [address]    Special Requests   Final    Blood Culture  adequate volume BOTTLES DRAWN AEROBIC AND ANAEROBIC Performed at [facility], [address] [place], [place] [address]    Culture   Final    NO GROWTH 4 DAYS Performed at [facility], [place] [address] [place], [place] [address]    Report Status PENDING  Incomplete      Radiology Studies: VAS [place] ABI WITH/WO TBI  Result Date: 06/25/2022  LOWER EXTREMITY DOPPLER STUDY Patient Name:  Jeffrey Fields  Date of Exam:   06/24/2022 Medical Rec #: [id number]         Accession #:    [id number] Date of Birth: 1956/07/28         Patient Gender: M Patient Age:   52 years Exam Location:  [facility] Procedure:      VAS [place] ABI WITH/WO TBI Referring Phys: Wandra Feinstein RATHORE --------------------------------------------------------------------------------  Indications: Ulceration, gangrene, and peripheral artery disease. High Risk Factors: Hypertension, hyperlipidemia, Diabetes. Other Factors: Osteomyelitis.  Comparison Study: No prior studies. Performing Technologist: Darlin Coco RDMS RVT  Examination Guidelines: A complete evaluation includes at minimum, Doppler waveform signals and systolic blood pressure reading at the level of bilateral brachial, anterior tibial, and posterior tibial arteries, when vessel segments are accessible. Bilateral testing is considered an integral part of a complete examination. Photoelectric Plethysmograph (PPG) waveforms and toe systolic pressure readings are included as required and additional duplex testing as needed. Limited examinations for reoccurring indications may be performed as noted.  ABI Findings: +---------+------------------+-----+-------------------+-----------------------+ Right    Rt Pressure (mmHg)IndexWaveform           Comment                 +---------+------------------+-----+-------------------+-----------------------+ Brachial 153                    triphasic                                   +---------+------------------+-----+-------------------+-----------------------+ PTA      101               0.66 dampened monophasicLikely falsely elevated                                                    in setting of arterial                                                     calcification           +---------+------------------+-----+-------------------+-----------------------+ DP       104               0.68 dampened monophasicLikely falsely elevated                                                    in setting of arterial  calcification           +---------+------------------+-----+-------------------+-----------------------+ Great Toe0                 0.00 Absent                                     +---------+------------------+-----+-------------------+-----------------------+ +---------+------------------+-----+-------------------+-----------------------+ Left     Lt Pressure (mmHg)IndexWaveform           Comment                 +---------+------------------+-----+-------------------+-----------------------+ Brachial 150                    triphasic                                  +---------+------------------+-----+-------------------+-----------------------+ PTA      0                 0.00 absent                                     +---------+------------------+-----+-------------------+-----------------------+ DP       146               0.95 dampened monophasicLikely falsely elevated                                                    in setting of arterial                                                     calcification           +---------+------------------+-----+-------------------+-----------------------+ Great Toe75                0.49 Abnormal                                   +---------+------------------+-----+-------------------+-----------------------+   Arterial wall calcification precludes accurate ankle pressures and ABIs.  Summary: Right: Resting right ankle-brachial index indicates moderate right lower extremity arterial disease. However, dampened monophasic Doppler waveforms suggest falsely elevated pressures. The right toe waveform is absent. Left: Although ankle brachial indices are within normal limits (0.95-1.29), arterial Doppler waveforms at the ankle suggest some component of arterial occlusive disease. The left toe-brachial index is abnormal.  *See table(s) above for measurements and observations.  Electronically signed by Monica Martinez MD on 06/25/2022 at 2:57:18 PM.    Final        LOS: 3 days   [facility] Pager on [email or web address]  06/26/2022, 11:24 AM

## 2022-06-26 NOTE — Care Management Important Message (Signed)
Important Message  Patient Details  Name: Jeffrey Fields MRN: [id number] Date of Birth: 11-11-1955   Medicare Important Message Given:  Yes     [name] 06/26/2022, 12:03 PM

## 2022-06-27 ENCOUNTER — Encounter (HOSPITAL_COMMUNITY): Payer: Self-pay | Admitting: Internal Medicine

## 2022-06-27 ENCOUNTER — Other Ambulatory Visit: Payer: Self-pay

## 2022-06-27 ENCOUNTER — Inpatient Hospital Stay (HOSPITAL_COMMUNITY): Payer: Medicare HMO | Admitting: Certified Registered Nurse Anesthetist

## 2022-06-27 ENCOUNTER — Other Ambulatory Visit (HOSPITAL_COMMUNITY): Payer: Self-pay

## 2022-06-27 ENCOUNTER — Encounter (HOSPITAL_COMMUNITY)
Admission: EM | Disposition: A | Discharge status: Home / Self Care | Payer: Self-pay | Source: Home / Self Care | Attending: Internal Medicine

## 2022-06-27 DIAGNOSIS — E1152 Type 2 diabetes mellitus with diabetic peripheral angiopathy with gangrene: Secondary | ICD-10-CM

## 2022-06-27 DIAGNOSIS — I509 Heart failure, unspecified: Secondary | ICD-10-CM

## 2022-06-27 DIAGNOSIS — D649 Anemia, unspecified: Secondary | ICD-10-CM | POA: Diagnosis not present

## 2022-06-27 DIAGNOSIS — I70261 Atherosclerosis of native arteries of extremities with gangrene, right leg: Secondary | ICD-10-CM

## 2022-06-27 DIAGNOSIS — I110 Hypertensive heart disease with heart failure: Secondary | ICD-10-CM | POA: Diagnosis not present

## 2022-06-27 DIAGNOSIS — Z7984 Long term (current) use of oral hypoglycemic drugs: Secondary | ICD-10-CM

## 2022-06-27 DIAGNOSIS — Z9861 Coronary angioplasty status: Secondary | ICD-10-CM

## 2022-06-27 DIAGNOSIS — M86271 Subacute osteomyelitis, right ankle and foot: Secondary | ICD-10-CM | POA: Diagnosis not present

## 2022-06-27 DIAGNOSIS — I252 Old myocardial infarction: Secondary | ICD-10-CM

## 2022-06-27 DIAGNOSIS — M86179 Other acute osteomyelitis, unspecified ankle and foot: Secondary | ICD-10-CM | POA: Diagnosis not present

## 2022-06-27 DIAGNOSIS — E11622 Type 2 diabetes mellitus with other skin ulcer: Secondary | ICD-10-CM | POA: Diagnosis not present

## 2022-06-27 DIAGNOSIS — I2510 Atherosclerotic heart disease of native coronary artery without angina pectoris: Secondary | ICD-10-CM

## 2022-06-27 DIAGNOSIS — Z794 Long term (current) use of insulin: Secondary | ICD-10-CM

## 2022-06-27 DIAGNOSIS — I5022 Chronic systolic (congestive) heart failure: Secondary | ICD-10-CM

## 2022-06-27 HISTORY — PX: AMPUTATION: SHX166

## 2022-06-27 LAB — GLUCOSE, CAPILLARY
Glucose-Capillary: 125 mg/dL — ABNORMAL HIGH (ref 70–99)
Glucose-Capillary: 82 mg/dL (ref 70–99)
Glucose-Capillary: 84 mg/dL (ref 70–99)
Glucose-Capillary: 86 mg/dL (ref 70–99)
Glucose-Capillary: 92 mg/dL (ref 70–99)
Glucose-Capillary: 96 mg/dL (ref 70–99)
Glucose-Capillary: 96 mg/dL (ref 70–99)

## 2022-06-27 LAB — CULTURE, BLOOD (ROUTINE X 2)
Culture: NO GROWTH
Culture: NO GROWTH
Special Requests: ADEQUATE
Special Requests: ADEQUATE

## 2022-06-27 LAB — TYPE AND SCREEN
ABO/RH(D): O POS
Antibody Screen: NEGATIVE

## 2022-06-27 LAB — ECHOCARDIOGRAM COMPLETE
Height: 75.5 in
S' Lateral: 3.9 cm
Single Plane A4C EF: 41 %
Weight: 2931.24 oz

## 2022-06-27 LAB — ABO/RH: ABO/RH(D): O POS

## 2022-06-27 SURGERY — AMPUTATION, ABOVE KNEE
Anesthesia: General | Site: Knee | Laterality: Right

## 2022-06-27 MED ORDER — MAGNESIUM CITRATE PO SOLN: 1.0000 | Freq: Once | ORAL | Status: DC | PRN

## 2022-06-27 MED ORDER — LIDOCAINE 2% (20 MG/ML) 5 ML SYRINGE
INTRAMUSCULAR | Status: DC | PRN
  Administered 2022-06-27: 100 mg via INTRAVENOUS

## 2022-06-27 MED ORDER — POVIDONE-IODINE 10 % EX SWAB
2.0000 | Freq: Once | CUTANEOUS | Status: AC
  Administered 2022-06-27: 2 via TOPICAL

## 2022-06-27 MED ORDER — ALBUMIN HUMAN 5 % IV SOLN: INTRAVENOUS | Status: DC | PRN

## 2022-06-27 MED ORDER — LACTATED RINGERS IV SOLN: INTRAVENOUS | Status: DC

## 2022-06-27 MED ORDER — VITAMIN C 500 MG PO TABS
1000.0000 mg | ORAL_TABLET | Freq: Every day | ORAL | Status: DC
  Administered 2022-06-27 – 2022-07-01 (×5): 1000 mg via ORAL
  Filled 2022-06-27 (×5): qty 2

## 2022-06-27 MED ORDER — ONDANSETRON HCL 4 MG/2ML IJ SOLN: 4.0000 mg | Freq: Once | INTRAMUSCULAR | Status: DC | PRN

## 2022-06-27 MED ORDER — LABETALOL HCL 5 MG/ML IV SOLN: 10.0000 mg | INTRAVENOUS | Status: DC | PRN

## 2022-06-27 MED ORDER — PROPOFOL 10 MG/ML IV BOLUS
INTRAVENOUS | Status: AC
  Filled 2022-06-27: qty 20

## 2022-06-27 MED ORDER — ORAL CARE MOUTH RINSE: 15.0000 mL | Freq: Once | OROMUCOSAL | Status: AC

## 2022-06-27 MED ORDER — CHLORHEXIDINE GLUCONATE 0.12 % MT SOLN: 15.0000 mL | Freq: Once | OROMUCOSAL | Status: AC

## 2022-06-27 MED ORDER — ZINC SULFATE 220 (50 ZN) MG PO CAPS
220.0000 mg | ORAL_CAPSULE | Freq: Every day | ORAL | Status: DC
  Administered 2022-06-27 – 2022-07-01 (×5): 220 mg via ORAL
  Filled 2022-06-27 (×5): qty 1

## 2022-06-27 MED ORDER — CEFAZOLIN SODIUM-DEXTROSE 2-4 GM/100ML-% IV SOLN: 2.0000 g | INTRAVENOUS | Status: DC

## 2022-06-27 MED ORDER — FENTANYL CITRATE (PF) 250 MCG/5ML IJ SOLN
INTRAMUSCULAR | Status: AC
  Filled 2022-06-27: qty 5

## 2022-06-27 MED ORDER — PHENOL 1.4 % MT LIQD: 1.0000 | OROMUCOSAL | Status: DC | PRN

## 2022-06-27 MED ORDER — POLYETHYLENE GLYCOL 3350 17 G PO PACK
17.0000 g | PACK | Freq: Every day | ORAL | Status: DC | PRN
  Administered 2022-06-30: 17 g via ORAL
  Filled 2022-06-27: qty 1

## 2022-06-27 MED ORDER — ASPIRIN 81 MG PO TBEC
81.0000 mg | DELAYED_RELEASE_TABLET | Freq: Every day | ORAL | Status: DC
  Administered 2022-06-28 – 2022-07-01 (×4): 81 mg via ORAL
  Filled 2022-06-27 (×4): qty 1

## 2022-06-27 MED ORDER — ONDANSETRON HCL 4 MG/2ML IJ SOLN: 4.0000 mg | Freq: Four times a day (QID) | INTRAMUSCULAR | Status: DC | PRN

## 2022-06-27 MED ORDER — PHENYLEPHRINE 80 MCG/ML (10ML) SYRINGE FOR IV PUSH (FOR BLOOD PRESSURE SUPPORT)
PREFILLED_SYRINGE | INTRAVENOUS | Status: AC
  Filled 2022-06-27: qty 20

## 2022-06-27 MED ORDER — JUVEN PO PACK
1.0000 | PACK | Freq: Two times a day (BID) | ORAL | Status: DC
  Administered 2022-06-28 – 2022-07-01 (×6): 1 via ORAL
  Filled 2022-06-27 (×6): qty 1

## 2022-06-27 MED ORDER — CEFAZOLIN SODIUM-DEXTROSE 2-4 GM/100ML-% IV SOLN
INTRAVENOUS | Status: AC
  Filled 2022-06-27: qty 100

## 2022-06-27 MED ORDER — OXYCODONE HCL 5 MG PO TABS: 5.0000 mg | ORAL_TABLET | Freq: Once | ORAL | Status: DC | PRN

## 2022-06-27 MED ORDER — ONDANSETRON HCL 4 MG/2ML IJ SOLN
INTRAMUSCULAR | Status: DC | PRN
  Administered 2022-06-27: 4 mg via INTRAVENOUS

## 2022-06-27 MED ORDER — POTASSIUM CHLORIDE CRYS ER 20 MEQ PO TBCR: 20.0000 meq | EXTENDED_RELEASE_TABLET | Freq: Every day | ORAL | Status: DC | PRN

## 2022-06-27 MED ORDER — ALUM & MAG HYDROXIDE-SIMETH 200-200-20 MG/5ML PO SUSP: 15.0000 mL | ORAL | Status: DC | PRN

## 2022-06-27 MED ORDER — HYDROMORPHONE HCL 1 MG/ML IJ SOLN
0.2500 mg | INTRAMUSCULAR | Status: DC | PRN
  Administered 2022-06-27 (×2): 0.5 mg via INTRAVENOUS

## 2022-06-27 MED ORDER — HYDRALAZINE HCL 20 MG/ML IJ SOLN: 5.0000 mg | INTRAMUSCULAR | Status: DC | PRN

## 2022-06-27 MED ORDER — MAGNESIUM SULFATE 2 GM/50ML IV SOLN: 2.0000 g | Freq: Every day | INTRAVENOUS | Status: DC | PRN

## 2022-06-27 MED ORDER — FENTANYL CITRATE (PF) 100 MCG/2ML IJ SOLN
INTRAMUSCULAR | Status: DC | PRN
  Administered 2022-06-27: 100 ug via INTRAVENOUS

## 2022-06-27 MED ORDER — 0.9 % SODIUM CHLORIDE (POUR BTL) OPTIME
TOPICAL | Status: DC | PRN
  Administered 2022-06-27: 1000 mL

## 2022-06-27 MED ORDER — PANTOPRAZOLE SODIUM 40 MG PO TBEC
40.0000 mg | DELAYED_RELEASE_TABLET | Freq: Every day | ORAL | Status: DC
  Administered 2022-06-27 – 2022-07-01 (×5): 40 mg via ORAL
  Filled 2022-06-27 (×5): qty 1

## 2022-06-27 MED ORDER — OXYCODONE HCL 5 MG PO TABS
10.0000 mg | ORAL_TABLET | ORAL | Status: DC | PRN
  Administered 2022-06-27: 10 mg via ORAL
  Administered 2022-06-28 – 2022-07-01 (×10): 15 mg via ORAL
  Filled 2022-06-27: qty 3
  Filled 2022-06-27: qty 2
  Filled 2022-06-27 (×10): qty 3

## 2022-06-27 MED ORDER — SODIUM CHLORIDE 0.9 % IV SOLN: INTRAVENOUS | Status: DC

## 2022-06-27 MED ORDER — MIDAZOLAM HCL 2 MG/2ML IJ SOLN
INTRAMUSCULAR | Status: DC | PRN
  Administered 2022-06-27: 2 mg via INTRAVENOUS

## 2022-06-27 MED ORDER — OXYCODONE HCL 5 MG/5ML PO SOLN: 5.0000 mg | Freq: Once | ORAL | Status: DC | PRN

## 2022-06-27 MED ORDER — DOCUSATE SODIUM 100 MG PO CAPS
100.0000 mg | ORAL_CAPSULE | Freq: Every day | ORAL | Status: DC
  Administered 2022-06-28 – 2022-07-01 (×3): 100 mg via ORAL
  Filled 2022-06-27 (×4): qty 1

## 2022-06-27 MED ORDER — ACETAMINOPHEN 325 MG PO TABS
325.0000 mg | ORAL_TABLET | Freq: Four times a day (QID) | ORAL | Status: DC | PRN
  Administered 2022-06-28 – 2022-06-30 (×2): 650 mg via ORAL
  Filled 2022-06-27 (×2): qty 2

## 2022-06-27 MED ORDER — CHLORHEXIDINE GLUCONATE 4 % EX LIQD: 60.0000 mL | Freq: Once | CUTANEOUS | Status: DC

## 2022-06-27 MED ORDER — BISACODYL 5 MG PO TBEC: 5.0000 mg | DELAYED_RELEASE_TABLET | Freq: Every day | ORAL | Status: DC | PRN

## 2022-06-27 MED ORDER — CHLORHEXIDINE GLUCONATE 0.12 % MT SOLN
OROMUCOSAL | Status: AC
  Administered 2022-06-27: 15 mL via OROMUCOSAL
  Filled 2022-06-27: qty 15

## 2022-06-27 MED ORDER — ENSURE PRE-SURGERY PO LIQD
296.0000 mL | Freq: Once | ORAL | Status: AC
  Administered 2022-06-27: 296 mL via ORAL
  Filled 2022-06-27: qty 296

## 2022-06-27 MED ORDER — PROPOFOL 10 MG/ML IV BOLUS
INTRAVENOUS | Status: DC | PRN
  Administered 2022-06-27: 200 mg via INTRAVENOUS

## 2022-06-27 MED ORDER — HYDROMORPHONE HCL 1 MG/ML IJ SOLN
INTRAMUSCULAR | Status: AC
  Administered 2022-06-27: 0.5 mg via INTRAVENOUS
  Filled 2022-06-27: qty 1

## 2022-06-27 MED ORDER — MIDAZOLAM HCL 2 MG/2ML IJ SOLN
INTRAMUSCULAR | Status: AC
  Filled 2022-06-27: qty 2

## 2022-06-27 MED ORDER — METOPROLOL TARTRATE 5 MG/5ML IV SOLN: 2.0000 mg | INTRAVENOUS | Status: DC | PRN

## 2022-06-27 MED ORDER — OXYCODONE HCL 5 MG PO TABS
5.0000 mg | ORAL_TABLET | ORAL | Status: DC | PRN
  Administered 2022-06-28 – 2022-06-30 (×2): 10 mg via ORAL
  Filled 2022-06-27 (×2): qty 2

## 2022-06-27 MED ORDER — HYDROMORPHONE HCL 1 MG/ML IJ SOLN
0.5000 mg | INTRAMUSCULAR | Status: DC | PRN
  Administered 2022-06-27 – 2022-07-01 (×10): 1 mg via INTRAVENOUS
  Filled 2022-06-27 (×10): qty 1

## 2022-06-27 MED ORDER — DEXAMETHASONE SODIUM PHOSPHATE 10 MG/ML IJ SOLN
INTRAMUSCULAR | Status: DC | PRN
  Administered 2022-06-27: 4 mg via INTRAVENOUS

## 2022-06-27 MED ORDER — GUAIFENESIN-DM 100-10 MG/5ML PO SYRP: 15.0000 mL | ORAL_SOLUTION | ORAL | Status: DC | PRN

## 2022-06-27 SURGICAL SUPPLY — 43 items
BAG COUNTER SPONGE SURGICOUNT (BAG) IMPLANT
BAG SPNG CNTER NS LX DISP (BAG)
BLADE SAW RECIP 87.9 MT (BLADE) ×1 IMPLANT
BLADE SURG 21 STRL SS (BLADE) ×1 IMPLANT
BNDG COHESIVE 6X5 TAN STRL LF (GAUZE/BANDAGES/DRESSINGS) ×1 IMPLANT
CANISTER WOUND CARE 500ML ATS (WOUND CARE) IMPLANT
COVER SURGICAL LIGHT HANDLE (MISCELLANEOUS) ×1 IMPLANT
CUFF TOURN SGL QUICK 34 (TOURNIQUET CUFF)
CUFF TRNQT CYL 34X4.125X (TOURNIQUET CUFF) IMPLANT
DRAPE DERMATAC (DRAPES) IMPLANT
DRAPE INCISE IOBAN 66X45 STRL (DRAPES) ×2 IMPLANT
DRAPE U-SHAPE 47X51 STRL (DRAPES) ×1 IMPLANT
DRESSING PREVENA PLUS CUSTOM (GAUZE/BANDAGES/DRESSINGS) ×1 IMPLANT
DRSG PREVENA PLUS CUSTOM (GAUZE/BANDAGES/DRESSINGS) ×1
DURAPREP 26ML APPLICATOR (WOUND CARE) ×1 IMPLANT
ELECT REM PT RETURN 9FT ADLT (ELECTROSURGICAL) ×1
ELECTRODE REM PT RTRN 9FT ADLT (ELECTROSURGICAL) ×1 IMPLANT
GLOVE BIOGEL PI IND STRL 7.5 (GLOVE) ×1 IMPLANT
GLOVE BIOGEL PI IND STRL 9 (GLOVE) ×1 IMPLANT
GLOVE SURG ORTHO 9.0 STRL STRW (GLOVE) ×1 IMPLANT
GLOVE SURG SS PI 6.5 STRL IVOR (GLOVE) ×1 IMPLANT
GOWN STRL REUS W/ TWL LRG LVL3 (GOWN DISPOSABLE) ×1 IMPLANT
GOWN STRL REUS W/ TWL XL LVL3 (GOWN DISPOSABLE) ×2 IMPLANT
GOWN STRL REUS W/TWL LRG LVL3 (GOWN DISPOSABLE) ×1
GOWN STRL REUS W/TWL XL LVL3 (GOWN DISPOSABLE) ×2
KIT BASIN OR (CUSTOM PROCEDURE TRAY) ×1 IMPLANT
KIT TURNOVER KIT B (KITS) ×1 IMPLANT
MANIFOLD NEPTUNE II (INSTRUMENTS) ×1 IMPLANT
NS IRRIG 1000ML POUR BTL (IV SOLUTION) ×1 IMPLANT
PACK ORTHO EXTREMITY (CUSTOM PROCEDURE TRAY) ×1 IMPLANT
PAD ARMBOARD 7.5X6 YLW CONV (MISCELLANEOUS) ×1 IMPLANT
PREVENA RESTOR ARTHOFORM 46X30 (CANNISTER) ×1 IMPLANT
SPONGE T-LAP 18X18 ~~LOC~~+RFID (SPONGE) IMPLANT
STAPLER VISISTAT 35W (STAPLE) IMPLANT
STOCKINETTE IMPERVIOUS LG (DRAPES) IMPLANT
SUT ETHILON 2 0 PSLX (SUTURE) ×2 IMPLANT
SUT SILK 2 0 (SUTURE)
SUT SILK 2-0 18XBRD TIE 12 (SUTURE) ×1 IMPLANT
SUT VIC AB 1 CT1 27 (SUTURE) ×2
SUT VIC AB 1 CT1 27XBRD ANTBC (SUTURE) IMPLANT
TOWEL GREEN STERILE FF (TOWEL DISPOSABLE) ×1 IMPLANT
TUBE CONNECTING 20X1/4 (TUBING) ×1 IMPLANT
YANKAUER SUCT BULB TIP NO VENT (SUCTIONS) ×1 IMPLANT

## 2022-06-27 NOTE — Anesthesia Postprocedure Evaluation (Signed)
Anesthesia Post Note  Patient: Jeffrey Fields  Procedure(s) Performed: RIGHT ABOVE KNEE AMPUTATION (Right: Knee)     Patient location during evaluation: PACU Anesthesia Type: General Level of consciousness: awake and alert, oriented and patient cooperative Pain management: pain level controlled Vital Signs Assessment: post-procedure vital signs reviewed and stable Respiratory status: spontaneous breathing, nonlabored ventilation and respiratory function stable Cardiovascular status: blood pressure returned to baseline and stable Postop Assessment: no apparent nausea or vomiting Anesthetic complications: no   No notable events documented.  Last Vitals:  Vitals:   06/27/22 1745 06/27/22 1800  BP: (!) 162/92 (!) 161/89  Pulse: 77 78  Resp: 14 13  Temp:    SpO2: 100% 100%    Last Pain:  Vitals:   06/27/22 1805  TempSrc:   PainSc: [place]

## 2022-06-27 NOTE — Interval H&P Note (Signed)
History and Physical Interval Note:  06/27/2022 6:50 AM  Jeffrey Fields  has presented today for surgery, with the diagnosis of Gangrene Right Leg.  The various methods of treatment have been discussed with the patient and family. After consideration of risks, benefits and other options for treatment, the patient has consented to  Procedure(s): RIGHT ABOVE KNEE AMPUTATION (Right) as a surgical intervention.  The patient's history has been reviewed, patient examined, no change in status, stable for surgery.  I have reviewed the patient's chart and labs.  Questions were answered to the patient's satisfaction.     Newt Minion

## 2022-06-27 NOTE — Transfer of Care (Signed)
Immediate Anesthesia Transfer of Care Note  Patient: Jeffrey Fields  Procedure(s) Performed: RIGHT ABOVE KNEE AMPUTATION (Right: Knee)  Patient Location: PACU  Anesthesia Type:General  Level of Consciousness: awake, alert  and oriented  Airway & Oxygen Therapy: Patient Spontanous Breathing and Patient connected to nasal cannula oxygen  Post-op Assessment: Report given to RN and Post -op Vital signs reviewed and stable  Post vital signs: Reviewed and stable  Last Vitals:  Vitals Value Taken Time  BP 153/91 06/27/22 1730  Temp    Pulse 95 06/27/22 1735  Resp 15 06/27/22 1735  SpO2 100 % 06/27/22 1735  Vitals shown include unvalidated device data.  Last Pain:  Vitals:   06/27/22 1543  TempSrc: Axillary  PainSc: 2       Patients Stated Pain Goal: 2 (27/03/50 0938)  Complications: No notable events documented.

## 2022-06-27 NOTE — TOC Benefit Eligibility Note (Signed)
Patient Research scientist (life sciences) completed.     The patient is currently admitted and upon discharge could be taking Jardiance 25mg  tablet.   The current 30 day co-pay is, $0.00.   The patient is insured through [employer].

## 2022-06-27 NOTE — Anesthesia Procedure Notes (Signed)
Procedure Name: LMA Insertion Date/Time: 06/27/2022 4:45 PM  Performed by: Leonor Liv, CRNAPre-anesthesia Checklist: Patient identified, Emergency Drugs available, Suction available and Patient being monitored Patient Re-evaluated:Patient Re-evaluated prior to induction Oxygen Delivery Method: Circle System Utilized Preoxygenation: Pre-oxygenation with 100% oxygen Induction Type: IV induction Ventilation: Mask ventilation without difficulty LMA: LMA inserted LMA Size: 5.0 Number of attempts: 1 Placement Confirmation: positive ETCO2 Tube secured with: Tape Dental Injury: Teeth and Oropharynx as per pre-operative assessment  Comments: Upper

## 2022-06-27 NOTE — Progress Notes (Signed)
[facility] PROGRESS NOTE   Jeffrey Fields GGE:[id number] DOB: 05-15-1956 DOA: 06/22/2022  PCP: [name], DO  Brief History/Interval Summary:  66 y.o. male with medical history significant of type 2 diabetes, hypertension, hyperlipidemia, paraplegia secondary to spinal stenosis presented to the ED with complaint of bilateral lower extremity/foot wounds.  Imaging studies raised concern for osteomyelitis involving bilateral feet.  Patient was hospitalized.  Orthopedics was consulted.    Consultants: Orthopedics  Procedures: None yet    Subjective/Interval History: Patient feels well.  Denies any chest pain shortness of breath nausea or vomiting.  Pain in the lower extremities is well controlled.     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  No clear evidence for sepsis otherwise. Imaging studies raise concern for osteomyelitis involving the base of the fifth proximal phalanx of the left foot and head of the fifth metatarsal.  Soft tissue gas was also noted.   There was also concern for osteomyelitis of the right fifth proximal phalanx and the mid to distal fifth metatarsal. Seen by orthopedics.  Plan is for right above-knee amputation.  ABIs suggest moderate disease in the right lower extremity.  Further plans per orthopedics. Wound care to the left lower extremity wounds. CRP noted to be 10.0.  ESR 79.  WBC normal.  Blood cultures negative so far.    Coronary artery disease Patient apparently sustained a non-STEMI in February.  This information was not available to [place] until 9/19.  Care everywhere was reviewed.  He was admitted at [facility].  Underwent PCI of LAD and left main and with 3 stents to LAD .  Placed on dual antiplatelets with aspirin and Plavix. It does not appear that patient has followed up with cardiology since then.  He does live  in [facility] so ideally should follow-up with cardiology group in [facility]. Medication list does not list any antiplatelet agents.  That after 30 days of taking Plavix he could not get it refilled.  Did not reach out to cardiology.  Has been taking aspirin.  Ideally needs to be on dual antiplatelet treatment due to presence of multiple stents especially in left main.  We will resume Plavix as soon as able to after surgery.  Can resume aspirin from tomorrow.  Cardiac status seems to be stable. Based on the [place] Perioperative Risk Index the patient's estimated risk probability for perioperative myocardial infarction or cardiac arrest is 1.75%.   Ischemic cardiomyopathy/chronic systolic CHF Care everywhere was reviewed.  Initially found to have a EF of 20 to 25% based on echocardiogram done at [facility] on 2/20.  Subsequent echocardiogram a few days later showed EF to be 25 to 30%..  At the time of discharge he was discharged on metoprolol lisinopril and Aldactone.   Currently on metoprolol lisinopril.  His Wilder Glade is on hold. Not noted to be on any diuretics. Volume status is stable.  Echocardiogram repeated here in the hospital.  Left ventricular EF has improved to 35 to 40%.  Global hypokinesis noted.  Diastolic dysfunction also seen.  No other significant abnormalities present. Patient will benefit from adjustment of his goal-directed medical therapy.  However compliance has been an issue.  We will prefer that this be addressed in the outpatient setting by his cardiologist.  This was discussed with the patient and he agrees.  Paroxysmal atrial fibrillation Was on amiodarone infusion at the  time of discharge from [facility] in February.  It does not appear the patient was ever started on anticoagulation.  EKG from the current admission shows sinus rhythm. Defer to outpatient cardiology.  Diabetes mellitus type 2, controlled HbA1c 4.9.  Holding oral agents.  SSI.  Started on D5 infusion  due to occasional hypoglycemia.  Glucose levels have improved.    Essential hypertension Currently noted to be on amlodipine lisinopril and metoprolol.  Blood pressure reasonably well controlled.  Will not be too aggressive with blood pressure control at this time.  Hyperlipidemia Noted to be on statin.  Microcytic anemia Mild drop in hemoglobin noted likely dilutional.  No evidence for overt bleeding.  Anemia panel reviewed.  Ferritin 39, TIBC 249, iron 26, percent saturation 10.  Vitamin G38 756 folic acid 43.3.  Macrocytosis is noted.  There could be an element of iron deficiency from a component of anemia of chronic disease.  Will need iron supplementation at discharge.  May need further work-up for anemia in the outpatient setting.  Paraplegia This is secondary to spinal stenosis.  Long history of same, ongoing for 20+ years.  Hypokalemia Will be repleted.  Magnesium 1.8.  Recheck labs tomorrow.  Concern for air under the diaphragm Abdominal films showed that this finding is due to gas in the bowels.  Abdomen is benign on examination.  Bowel regimen. No further work-up at this time.   DVT Prophylaxis: Lovenox Code Status: Full code Family Communication: Discussed with patient Disposition Plan: To be determined.  He does live by himself but he has good support from family.  Status is: Inpatient Remains inpatient appropriate because: Need for IV antibiotics, possible need for surgical intervention      Medications: Scheduled:  amLODipine  10 mg Oral Daily   collagenase   Topical Daily   enoxaparin (LOVENOX) injection  40 mg Subcutaneous Daily   insulin aspart  0-9 Units Subcutaneous Q4H   leptospermum manuka honey  1 Application Topical Daily   lisinopril  10 mg Oral BID   metoprolol tartrate  50 mg Oral BID   rosuvastatin  10 mg Oral Once per day on Mon Thu   Continuous:  ceFEPime (MAXIPIME) IV 2 g (06/27/22 0550)   dextrose 5 % and 0.45% NaCl 50 mL/hr at 06/26/22  1551   vancomycin 750 mg (06/27/22 2951)   OAC:[id number] **OR** acetaminophen, hydrALAZINE, oxyCODONE-acetaminophen  Antibiotics: Anti-infectives (From admission, onward)    Start     Dose/Rate Route Frequency Ordered Stop   06/26/22 0000  ceFEPIme (MAXIPIME) 2 g in sodium chloride 0.9 % 100 mL IVPB        2 g 200 mL/hr over 30 Minutes Intravenous Every 8 hours 06/25/22 2354     06/23/22 1200  vancomycin (VANCOREADY) IVPB 750 mg/150 mL        750 mg 150 mL/hr over 60 Minutes Intravenous Every 12 hours 06/23/22 0528     06/23/22 0615  ceFEPIme (MAXIPIME) 2 g in sodium chloride 0.9 % 100 mL IVPB  Status:  Discontinued        2 g 200 mL/hr over 30 Minutes Intravenous Every 8 hours 06/23/22 0526 06/25/22 2354   06/22/22 2145  vancomycin (VANCOREADY) IVPB 2000 mg/400 mL  Status:  Discontinued        2,000 mg 200 mL/hr over 120 Minutes Intravenous  Once 06/22/22 2134 06/22/22 2138   06/22/22 2145  cefTRIAXone (ROCEPHIN) 2 g in sodium chloride 0.9 % 100 mL IVPB  2 g 200 mL/hr over 30 Minutes Intravenous  Once 06/22/22 2134 06/22/22 2330   06/22/22 2145  vancomycin (VANCOCIN) IVPB 1000 mg/200 mL premix       See Hyperspace for full Linked Orders Report.   1,000 mg 200 mL/hr over 60 Minutes Intravenous  Once 06/22/22 2137 06/23/22 0057   06/22/22 2145  vancomycin (VANCOCIN) IVPB 1000 mg/200 mL premix       See Hyperspace for full Linked Orders Report.   1,000 mg 200 mL/hr over 60 Minutes Intravenous  Once 06/22/22 2137 06/23/22 0057       Objective:  Vital Signs  Vitals:   06/26/22 2113 06/27/22 0004 06/27/22 0618 06/27/22 0729  BP: (!) 147/81 (!) 159/87 (!) 158/83 (!) 140/83  Pulse: 91  86 86  Resp: _0 Temp: 98.6 F (37 C)  98.4 F (36.9 C) 98.6 F (37 C)  TempSrc: Oral  Oral Oral  SpO2: 100%  98% 99%  Weight:      Height:        Intake/Output Summary (Last 24 hours) at 06/27/2022 0914 Last data filed at 06/27/2022 0700 Gross per 24 hour  Intake  297.98 ml  Output 1700 ml  Net -1402.02 ml    Filed Weights   06/23/22 0145  Weight: 83.1 kg    General appearance: Awake alert.  In no distress Resp: Clear to auscultation bilaterally.  Normal effort Cardio: S1-S2 is normal regular.  No S3-S4.  No rubs murmurs or bruit GI: Abdomen is soft.  Nontender nondistended.  Bowel sounds are present normal.  No masses organomegaly Extremities: Lower extremities covered in dressing      Lab Results:  Data Reviewed: I have personally reviewed following labs and reports of the imaging studies  CBC: Recent Labs  Lab 06/22/22 2006 06/23/22 0831 06/24/22 0034 06/26/22 0123  WBC 8.9 9.0 7.6 8.4  NEUTROABS 6.7  --   --   --   HGB 9.9* 8.6* 8.5* 8.3*  HCT 32.3* 28.0* 27.4* 26.2*  MCV 73.7* 73.7* 72.9* 72.8*  PLT 391 336 294 309     Basic Metabolic Panel: Recent Labs  Lab 06/22/22 2006 06/23/22 0831 06/24/22 0034 06/26/22 0123  NA 136 138 136 137  K 3.3* 3.0* 3.7 3.4*  CL 97* 104 103 106  CO2 _1 GLUCOSE 115* 85 83 75  BUN _2 CREATININE 0.99 0.82 0.79 0.79  CALCIUM 8.8* 8.2* 8.7* 8.9  MG  --   --  1.8  --      GFR: Estimated Creatinine Clearance: 105.3 mL/min (by C-G formula based on SCr of 0.79 mg/dL).  Liver Function Tests: Recent Labs  Lab 06/22/22 2006  AST 18  ALT 14  ALKPHOS 84  BILITOT 0.5  PROT 8.2*  ALBUMIN 3.1*      Coagulation Profile: Recent Labs  Lab 06/22/22 2006  INR 1.1       CBG: Recent Labs  Lab 06/26/22 1626 06/26/22 2112 06/26/22 2348 06/27/22 0422 06/27/22 0728  GLUCAP 82 130* 96 125* 96      Recent Results (from the past 240 hour(s))  Culture, blood (Routine x 2)     Status: None (Preliminary result)   Collection Time: 06/22/22  8:50 PM   Specimen: BLOOD RIGHT FOREARM  Result Value Ref Range Status   Specimen Description   Final    BLOOD RIGHT FOREARM BLOOD Performed at [facility], [address]  [place], [place] [address]     Special Requests   Final    Blood Culture adequate volume BOTTLES DRAWN AEROBIC AND ANAEROBIC Performed at [facility], [address] [place], [place] [address]    Culture   Final    NO GROWTH 4 DAYS Performed at [facility], [place] [address] [place], [place] [address]    Report Status PENDING  Incomplete  Culture, blood (Routine x 2)     Status: None (Preliminary result)   Collection Time: 06/22/22  8:59 PM   Specimen: Right Antecubital; Blood  Result Value Ref Range Status   Specimen Description   Final    RIGHT ANTECUBITAL BLOOD Performed at [facility], [address] [place], [place] [address]    Special Requests   Final    Blood Culture adequate volume BOTTLES DRAWN AEROBIC AND ANAEROBIC Performed at [facility], [address], [place], [place] [address]    Culture   Final    NO GROWTH 4 DAYS Performed at [facility], [place] [address], [place], [place] [address]    Report Status PENDING  Incomplete  Surgical pcr screen     Status: None   Collection Time: 06/26/22  6:03 PM   Specimen: Nasal Mucosa; Nasal Swab  Result Value Ref Range Status   MRSA, PCR NEGATIVE NEGATIVE Final   Staphylococcus aureus NEGATIVE NEGATIVE Final    Comment: (NOTE) The Xpert SA Assay (FDA approved for NASAL specimens in patients 14 years of age and older), is one component of a comprehensive surveillance program. It is not intended to diagnose infection nor to guide or monitor treatment. Performed at [facility], [place] [address] [place], [place] [address]       Radiology Studies: ECHOCARDIOGRAM COMPLETE  Result Date: 06/27/2022    ECHOCARDIOGRAM REPORT   Patient Name:   Jeffrey Fields Date of Exam: 06/26/2022 Medical Rec #:  [id number]        Height:       75.5 in Accession #:    [id number]       Weight:       183.2 lb Date of Birth:  03-10-1956        BSA:          2.125 m Patient Age:    46 years         BP:           139/86  mmHg Patient Gender: M                HR:           88 bpm. Exam Location:  Inpatient Procedure: 2D Echo Indications:    pre-operative evaluation  History:        Patient has no prior history of Echocardiogram examinations.                 Signs/Symptoms:Edema; Risk Factors:Diabetes, Hypertension and                 Dyslipidemia.  Sonographer:    Johny Chess RDCS Referring Phys: [phone] [facility]  Sonographer Comments: Suboptimal subcostal window. IMPRESSIONS  1. Left ventricular ejection fraction, by estimation, is 35 to 40%. The left ventricle has moderately decreased function. The left ventricle demonstrates global hypokinesis. Left ventricular diastolic parameters are consistent with Grade I diastolic dysfunction (impaired relaxation).  2. Right ventricular systolic function is normal. The right ventricular  size is normal.  3. Left atrial size was mildly dilated.  4. The mitral valve is normal in structure. Mild mitral valve regurgitation. No evidence of mitral stenosis.  5. The aortic valve was not well visualized. Aortic valve regurgitation is not visualized. No aortic stenosis is present.  6. The inferior vena cava is normal in size with greater than 50% respiratory variability, suggesting right atrial pressure of 3 mmHg. FINDINGS  Left Ventricle: Left ventricular ejection fraction, by estimation, is 35 to 40%. The left ventricle has moderately decreased function. The left ventricle demonstrates global hypokinesis. The left ventricular internal cavity size was normal in size. There is no left ventricular hypertrophy. Left ventricular diastolic parameters are consistent with Grade I diastolic dysfunction (impaired relaxation). Right Ventricle: The right ventricular size is normal. No increase in right ventricular wall thickness. Right ventricular systolic function is normal. Left Atrium: Left atrial size was mildly dilated. Right Atrium: Right atrial size was normal in size. Pericardium: There is no  evidence of pericardial effusion. Mitral Valve: The mitral valve is normal in structure. Mild mitral valve regurgitation. No evidence of mitral valve stenosis. Tricuspid Valve: The tricuspid valve is normal in structure. Tricuspid valve regurgitation is not demonstrated. No evidence of tricuspid stenosis. Aortic Valve: The aortic valve was not well visualized. Aortic valve regurgitation is not visualized. No aortic stenosis is present. Pulmonic Valve: The pulmonic valve was normal in structure. Pulmonic valve regurgitation is not visualized. No evidence of pulmonic stenosis. Aorta: The aortic root is normal in size and structure. Venous: The inferior vena cava is normal in size with greater than 50% respiratory variability, suggesting right atrial pressure of 3 mmHg. IAS/Shunts: No atrial level shunt detected by color flow Doppler.  LEFT VENTRICLE PLAX 2D LVIDd:         5.10 cm      Diastology LVIDs:         3.90 cm      LV e' medial:  5.22 cm/s LV PW:         1.00 cm      LV e' lateral: 6.53 cm/s LV IVS:        1.10 cm LVOT diam:     2.10 cm LV SV:         61 LV SV Index:   29 LVOT Area:     3.46 cm  LV Volumes (MOD) LV vol d, MOD A4C: 140.0 ml LV vol s, MOD A4C: 82.6 ml LV SV MOD A4C:     140.0 ml RIGHT VENTRICLE RV S prime:     17.00 cm/s TAPSE (M-mode): 2.7 cm LEFT ATRIUM             Index        RIGHT ATRIUM           Index LA diam:        3.60 cm 1.69 cm/m   RA Area:     17.50 cm LA Vol (A2C):   72.9 ml 34.31 ml/m  RA Volume:   45.40 ml  21.37 ml/m LA Vol (A4C):   67.2 ml 31.63 ml/m LA Biplane Vol: 72.1 ml 33.94 ml/m  AORTIC VALVE LVOT Vmax:   104.00 cm/s LVOT Vmean:  70.000 cm/s LVOT VTI:    0.177 m  AORTA Ao Root diam: 3.50 cm  SHUNTS Systemic VTI:  0.18 m Systemic Diam: 2.10 cm Candee Furbish MD Electronically signed by Candee Furbish MD Signature Date/Time: 06/27/2022/6:29:11 AM    Final  LOS: 4 days    [employer] on [email or web address]  06/27/2022, 9:14 AM

## 2022-06-27 NOTE — Op Note (Signed)
06/27/2022  5:43 PM  PATIENT:  Jeffrey Fields    PRE-OPERATIVE DIAGNOSIS:  Gangrene Right Leg  POST-OPERATIVE DIAGNOSIS:  Same  PROCEDURE:  RIGHT ABOVE KNEE AMPUTATION Application of Prevena customizable wound VAC. SURGEON:  Newt Minion, MD  PHYSICIAN ASSISTANT:None ANESTHESIA:   General  PREOPERATIVE INDICATIONS:  [name] is a  66 y.o. male with a diagnosis of Gangrene Right Leg who failed conservative measures and elected for surgical management.    The risks benefits and alternatives were discussed with the patient preoperatively including but not limited to the risks of infection, bleeding, nerve injury, cardiopulmonary complications, the need for revision surgery, among others, and the patient was willing to proceed.  OPERATIVE IMPLANTS: None  @ENCIMAGES @  OPERATIVE FINDINGS: Good petechial bleeding, femoral vessels were completely calcified, margins were clear.  OPERATIVE PROCEDURE: Patient was brought the operating room and underwent a general anesthetic.  After adequate levels anesthesia obtained patient's right lower extremity was prepped using DuraPrep draped into a sterile field a timeout was called.  A fishmouth incision was made proximal to the patella this was carried down to the femoral vessels these were clamped and suture-ligated with 2-0 silk.  A reciprocating saw was used to complete the amputation through the femur a 21 blade knife was used to complete the soft tissue amputation.  Electrocautery was used for hemostasis the wound was irrigated with normal saline.  The deep and superficial fascial layers were closed using #1 Vicryl skin was closed using staples.  A Praveena customizable wound VAC was applied this had a good suction fit patient was extubated taken the PACU in stable condition.   DISCHARGE PLANNING:  Antibiotic duration: Continue antibiotics for 24 hours.  Margins were clear.  Weightbearing: Patient is complete assist for transfers.   Anticipate patient can discharge to home in a day or 2.  Pain medication: Opioid pathway  Dressing care/ Wound VAC: Continue with wound VAC anticipate discharge with the Praveena plus portable wound VAC pump.  Ambulatory devices: Transfers.  Patient is nonambulatory.  Discharge to: Plan for discharge to home in a day or 2.  Follow-up: In the office 1 week post operative.

## 2022-06-27 NOTE — Anesthesia Preprocedure Evaluation (Addendum)
Anesthesia Evaluation  Patient identified by MRN, date of birth, ID band Patient awake    Reviewed: Allergy & Precautions, NPO status , Patient's Chart, lab work & pertinent test results  History of Anesthesia Complications Negative for: history of anesthetic complications  Airway Mallampati: II  TM Distance: >3 FB Neck ROM: Limited    Dental  (+) Dental Advisory Given, Missing, Poor Dentition   Pulmonary neg pulmonary ROS,    Pulmonary exam normal        Cardiovascular hypertension, + Past MI, + Cardiac Stents (Feb 2023) and +CHF  Normal cardiovascular exam+ dysrhythmias Atrial Fibrillation   Echo 06/26/22:  1. Left ventricular ejection fraction, by estimation, is 35 to 40%. The left ventricle has moderately decreased function. The left ventricle demonstrates global hypokinesis. Left ventricular diastolic parameters are consistent with Grade I diastolic dysfunction (impaired relaxation). 2. Right ventricular systolic function is normal. The right ventricular size is normal. 3. Left atrial size was mildly dilated. 4. The mitral valve is normal in structure. Mild mitral valve regurgitation. No evidence of mitral stenosis. 5. The aortic valve was not well visualized. Aortic valve regurgitation is not visualized. No aortic stenosis is present. 6. The inferior vena cava is normal in size with greater than 50% respiratory variability, suggesting right atrial pressure of 3 mmHg.   Neuro/Psych negative neurological ROS     GI/Hepatic negative GI ROS, Neg liver ROS,   Endo/Other  diabetes  Renal/GU negative Renal ROS  negative genitourinary   Musculoskeletal negative musculoskeletal ROS (+)   Abdominal   Peds  Hematology  (+) Blood dyscrasia, anemia ,   Anesthesia Other Findings   Reproductive/Obstetrics                           Anesthesia Physical Anesthesia Plan  ASA: 3  Anesthesia Plan:  General   Post-op Pain Management: Tylenol PO (pre-op)*   Induction: Intravenous  PONV Risk Score and Plan: 2 and Ondansetron, Dexamethasone, Treatment may vary due to age or medical condition and Midazolam  Airway Management Planned: LMA  Additional Equipment: None  Intra-op Plan:   Post-operative Plan: Extubation in OR  Informed Consent: I have reviewed the patients History and Physical, chart, labs and discussed the procedure including the risks, benefits and alternatives for the proposed anesthesia with the patient or authorized representative who has indicated his/her understanding and acceptance.     Dental advisory given  Plan Discussed with:   Anesthesia Plan Comments:         Anesthesia Quick Evaluation

## 2022-06-28 ENCOUNTER — Encounter (HOSPITAL_COMMUNITY): Payer: Self-pay | Admitting: Orthopedic Surgery

## 2022-06-28 DIAGNOSIS — D649 Anemia, unspecified: Secondary | ICD-10-CM | POA: Diagnosis not present

## 2022-06-28 DIAGNOSIS — E11622 Type 2 diabetes mellitus with other skin ulcer: Secondary | ICD-10-CM | POA: Diagnosis not present

## 2022-06-28 DIAGNOSIS — M86179 Other acute osteomyelitis, unspecified ankle and foot: Secondary | ICD-10-CM | POA: Diagnosis not present

## 2022-06-28 DIAGNOSIS — I2510 Atherosclerotic heart disease of native coronary artery without angina pectoris: Secondary | ICD-10-CM | POA: Diagnosis not present

## 2022-06-28 LAB — BASIC METABOLIC PANEL
Anion gap: 7 (ref 5–15)
BUN: 10 mg/dL (ref 8–23)
CO2: 26 mmol/L (ref 22–32)
Calcium: 9.1 mg/dL (ref 8.9–10.3)
Chloride: 103 mmol/L (ref 98–111)
Creatinine, Ser: 0.98 mg/dL (ref 0.61–1.24)
GFR, Estimated: >60 mL/min (ref >60)
Glucose, Bld: 144 mg/dL — ABNORMAL HIGH (ref 70–99)
Potassium: 4.2 mmol/L (ref 3.5–5.1)
Sodium: 136 mmol/L (ref 135–145)

## 2022-06-28 LAB — GLUCOSE, CAPILLARY
Glucose-Capillary: 100 mg/dL — ABNORMAL HIGH (ref 70–99)
Glucose-Capillary: 106 mg/dL — ABNORMAL HIGH (ref 70–99)
Glucose-Capillary: 116 mg/dL — ABNORMAL HIGH (ref 70–99)
Glucose-Capillary: 127 mg/dL — ABNORMAL HIGH (ref 70–99)
Glucose-Capillary: 187 mg/dL — ABNORMAL HIGH (ref 70–99)

## 2022-06-28 LAB — CBC
HCT: 27.1 % — ABNORMAL LOW (ref 39.0–52.0)
Hemoglobin: 8.3 g/dL — ABNORMAL LOW (ref 13.0–17.0)
MCH: 22.4 pg — ABNORMAL LOW (ref 26.0–34.0)
MCHC: 30.6 g/dL (ref 30.0–36.0)
MCV: 73.2 fL — ABNORMAL LOW (ref 80.0–100.0)
Platelets: 306 10*3/uL (ref 150–400)
RBC: 3.7 MIL/uL — ABNORMAL LOW (ref 4.22–5.81)
RDW: 17 % — ABNORMAL HIGH (ref 11.5–15.5)
WBC: 9.3 10*3/uL (ref 4.0–10.5)
nRBC: 0 % (ref 0.0–0.2)

## 2022-06-28 MED ORDER — CLOPIDOGREL BISULFATE 75 MG PO TABS
75.0000 mg | ORAL_TABLET | Freq: Every day | ORAL | Status: DC
  Administered 2022-06-28 – 2022-07-01 (×4): 75 mg via ORAL
  Filled 2022-06-28 (×4): qty 1

## 2022-06-28 MED ORDER — AMOXICILLIN-POT CLAVULANATE 875-125 MG PO TABS
1.0000 | ORAL_TABLET | Freq: Two times a day (BID) | ORAL | Status: DC
  Administered 2022-06-28 – 2022-07-01 (×7): 1 via ORAL
  Filled 2022-06-28 (×7): qty 1

## 2022-06-28 NOTE — Progress Notes (Signed)
Mobility Specialist Progress Note   06/28/22 1530  Mobility  Activity Transferred from chair to bed  Level of Assistance +2 (takes two people)  Assistive Device  (HHA)  RLE Weight Bearing NWB  Activity Response Tolerated well  $Mobility charge 1 Mobility   pt requesting assistance to get from chair to bed d/t fatigue from chair. Required +2A for physical assistance to laterally scoot pt to bed from the right side of the bed using chuck pads. Needed mod cues throughout for sequencing as well. Pt left in bed with no faults, all needs met, and call bell in reach.  [name] Mobility Specialist MS [facility] #:  [phone] Acute Rehab Office:  [phone]

## 2022-06-28 NOTE — Evaluation (Signed)
Physical Therapy Evaluation Patient Details Name: Jeffrey Fields MRN: [id number] DOB: 1956/08/20 Today's Date: 06/28/2022  History of Present Illness  66 yo male presenting to ED on 9/15 with worsening wounds at buttock and RLE. 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 PMH including type 2 diabetes, hypertension, hyperlipidemia, paraplegia secondary to spinal stenosis.  Clinical Impression  Pt admitted with above diagnosis. Pt was able to sit EOB x 20 min with max assist initially progressing to min guard assist.  Pt very participatory and has great family support. REcommend AIR so that pt can reach previous wheelchair level.   Pt currently with functional limitations due to balance and endurance deficits. Pt will benefit from skilled PT to increase their independence and safety with mobility to allow discharge to the venue listed below.          Recommendations for follow up therapy are one component of a multi-disciplinary discharge planning process, led by the attending physician.  Recommendations may be updated based on patient status, additional functional criteria and insurance authorization.  Follow Up Recommendations Acute inpatient rehab (3hours/day)      Assistance Recommended at Discharge Frequent or constant Supervision/Assistance  Patient can return home with the following  Two people to help with walking and/or transfers;A lot of help with bathing/dressing/bathroom;Assistance with cooking/housework;Assist for transportation;Help with stairs or ramp for entrance    Equipment Recommendations None recommended by PT  Recommendations for Other Services  Rehab consult    Functional Status Assessment Patient has had a recent decline in their functional status and demonstrates the ability to make significant improvements in function in a reasonable and predictable amount of time.     Precautions / Restrictions Precautions Precautions: Fall Restrictions Weight Bearing Restrictions: Yes RLE Weight  Bearing: Non weight bearing      Mobility  Bed Mobility Overal bed mobility: Needs Assistance Bed Mobility: Supine to Sit     Supine to sit: Min assist, +2 for physical assistance, HOB elevated     General bed mobility comments: Min A for elevating trunk adn bringing hips towards EOB with pad    Transfers Overall transfer level: Needs assistance   Transfers: Bed to chair/wheelchair/BSC            Lateral/Scoot Transfers: Mod assist, +2 physical assistance General transfer comment: lateral scoot to drop arm recliner. Mod A +2 with use of pad to facilitate hips over. Cues thorughout for forward weight shift of trunk.    Ambulation/Gait                  Stairs            Wheelchair Mobility    Modified Rankin (Stroke Patients Only)       Balance Overall balance assessment: Needs assistance Sitting-balance support: Bilateral upper extremity supported, Feet supported Sitting balance-Leahy Scale: Poor Sitting balance - Comments: Sat EOB with max assist initially progressing to min guard with incr balance activities to include reaching.  Pt sat about 20 min on EOB                                     Pertinent Vitals/Pain Pain Assessment Pain Assessment: Faces Faces Pain Scale: Hurts little more Breathing: normal Negative Vocalization: none Facial Expression: smiling or inexpressive Body Language: relaxed Consolability: no need to console PAINAD Score: 0 Pain Location: RLE Pain Descriptors / Indicators: Discomfort Pain Intervention(s): Limited activity within patient's tolerance,  Monitored during session, Repositioned, Premedicated before session    [place] expects to be discharged to:: Private residence Living Arrangements: Alone Available Help at Discharge: Family;Personal care attendant (Daughter, "wife?", and aide) Type of Home: Apartment (first floor) Home Access: Level entry       Home Layout: One  level Home Equipment: BSC/3in1;Wheelchair - manual (hospital bed) Additional Comments: CNA coming 8-1 each day. Daughter stays in afternoons after picking up her son from school.    Prior Function Prior Level of Function : Needs assist             Mobility Comments: Has decreased OOB activity tsince stent placement in Feb. Aide assists in transfers to OOB to w/c in preparation for doctors appoint. ADLs Comments: Aide assisting with all ADLs at bed level or wheel chair. Using transportation to doctor's appointments     Hand Dominance        Extremity/Trunk Assessment   Upper Extremity Assessment Upper Extremity Assessment: Defer to OT evaluation    Lower Extremity Assessment Lower Extremity Assessment: LLE deficits/detail;RLE deficits/detail RLE: Unable to fully assess due to pain;Unable to fully assess due to immobilization (VAC in place) LLE Deficits / Details: Contracted in knee flexion to 15 degrees, ankle DF/PF limited as well, hip grossly 2-/5    Cervical / Trunk Assessment Cervical / Trunk Assessment: Kyphotic  Communication   Communication: No difficulties  Cognition Arousal/Alertness: Awake/alert Behavior During Therapy: WFL for tasks assessed/performed Overall Cognitive Status: Impaired/Different from baseline Area of Impairment: Awareness                           Awareness: Emergent   General Comments: Decreased awareness of posterior lean and requiring cues throughout. Poor safety awareness. Feel this is close to baseline        General Comments General comments (skin integrity, edema, etc.): Daughter and wife presenting at 9 of session to collect home information    Exercises General Exercises - Lower Extremity Long Arc Quad: Left, AROM, 10 reps, Seated   Assessment/Plan    PT Assessment Patient needs continued PT services  PT Problem List Decreased mobility;Decreased balance;Decreased activity tolerance;Decreased knowledge of  use of DME;Decreased safety awareness;Decreased knowledge of precautions       PT Treatment Interventions DME instruction;Functional mobility training;Therapeutic activities;Therapeutic exercise;Balance training;Patient/family education;Wheelchair mobility training    PT Goals (Current goals can be found in the Care Plan section)  Acute Rehab PT Goals Patient Stated Goal: to go home PT Goal Formulation: With patient Time For Goal Achievement: 07/12/22 Potential to Achieve Goals: Good    Frequency Min 4X/week     Co-evaluation PT/OT/SLP Co-Evaluation/Treatment: Yes Reason for Co-Treatment: Complexity of the patient's impairments (multi-system involvement);For patient/therapist safety PT goals addressed during session: Mobility/safety with mobility         AM-PAC PT "6 Clicks" Mobility  Outcome Measure Help needed turning from your back to your side while in a flat bed without using bedrails?: Total Help needed moving from lying on your back to sitting on the side of a flat bed without using bedrails?: Total Help needed moving to and from a bed to a chair (including a wheelchair)?: Total Help needed standing up from a chair using your arms (e.g., wheelchair or bedside chair)?: Total Help needed to walk in hospital room?: Total Help needed climbing 3-5 steps with a railing? : Total 6 Click Score: 6    End of Session Equipment Utilized During Treatment:  Gait belt Activity Tolerance: Patient limited by fatigue Patient left: in chair;with call bell/phone within reach;with chair alarm set Nurse Communication: Mobility status PT Visit Diagnosis: Muscle weakness (generalized) (M62.81)    Time: 3546-5681 PT Time Calculation (min) (ACUTE ONLY): 32 min   Charges:   PT Evaluation $PT Eval Moderate Complexity: 1 Mod           M,PT Acute Rehab Services [phone]   Alvira Philips 06/28/2022, 1:58 PM

## 2022-06-28 NOTE — Progress Notes (Signed)
Patient ID: Jeffrey Fields, male   DOB: 1955-12-13, 66 y.o.   MRN: [id number] Patient is postoperative day 1 right above-knee amputation.  Patient states that his ischemic pain is resolved.  There is no drainage in the wound VAC canister, however, there is not a good enough seal to discharge on the portable Praveena pump.  I will place orders for a dry dressing change at discharge.

## 2022-06-28 NOTE — Progress Notes (Signed)
[facility] PROGRESS NOTE   Jeffrey Fields [id number] DOB: April 16, 1956 DOA: 06/22/2022  PCP: [name], DO  Brief History/Interval Summary:  66 y.o. male with medical history significant of type 2 diabetes, hypertension, hyperlipidemia, paraplegia secondary to spinal stenosis presented to the ED with complaint of bilateral lower extremity/foot wounds.  Imaging studies raised concern for osteomyelitis involving bilateral feet.  Patient was hospitalized.  Orthopedics was consulted.    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  Denies any chest pain shortness of breath nausea or vomiting.  Pain in the lower extremities is well controlled.     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  No clear evidence for sepsis otherwise. Imaging studies raise concern for osteomyelitis involving the base of the fifth proximal phalanx of the left foot and head of the fifth metatarsal.  Soft tissue gas was also noted.   There was also concern for osteomyelitis of the right fifth proximal phalanx and the mid to distal fifth metatarsal. Patient was started on broad-spectrum antibiotics.  Seen by orthopedics.  ABIs were done which showed moderate disease of the right lower extremity.  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 Discussed with Dr. Lenise Herald today.  Margins were clear.  Currently noted to be on wound VAC without much drainage that is noted.  May not need to be discharged with wound VAC.  But will defer to orthopedics. His cultures have been negative.  We will stop was broad-spectrum antibiotics.  We will switch him over to Augmentin.  MRSA PCR was negative. WBC noted to be normal.  Coronary artery disease Patient apparently sustained a non-STEMI in February.  This  information was not available to [place] until 9/19.  Care everywhere was reviewed.  He was admitted at [facility].  Underwent PCI of LAD and left main and with 3 stents to LAD .  Placed on dual antiplatelets with aspirin and Plavix. It does not appear that patient has followed up with cardiology since then.  He does live in [facility] [place] so ideally should follow-up with cardiology group in [facility]. Medication list does not list any antiplatelet agents.  He mentioned that after 30 days of taking Plavix he could not get it refilled and did not reach out to cardiology.  Has been taking aspirin.  Ideally needs to be on dual antiplatelet treatment due to presence of multiple stents. Discussed with Dr. Lenise Herald today.  Okay to resume both aspirin and Plavix which will be done. Needs to be stable from a cardiac standpoint.  Tolerated surgery well.  Ischemic cardiomyopathy/chronic systolic CHF Care everywhere was reviewed.  Initially found to have a EF of 20 to 25% based on echocardiogram done at [facility] regional on 2/20.  Subsequent echocardiogram a few days later showed EF to be 25 to 30%..  At the time of discharge he was discharged on metoprolol lisinopril and Aldactone.   Currently on metoprolol lisinopril.  His Wilder Glade is on hold. Not noted to be on any diuretics. Volume status is stable.  Echocardiogram repeated here in the hospital.  Left ventricular EF has improved to 35 to 40%.  Global hypokinesis noted.  Diastolic dysfunction also seen.  No other significant abnormalities present. Patient will benefit from adjustment of his goal-directed medical therapy.  However compliance has been an issue.  We will prefer that  this be addressed in the outpatient setting by his cardiologist.  This was discussed with the patient and he agrees. He is supposed to be followed by Dr. Horton Finer.   Paroxysmal atrial fibrillation Was on amiodarone at the time of discharge from [facility] in February.  Does  not seem to be on it anymore.  EKG shows sinus rhythm.  He is on metoprolol. It does not appear the patient was ever started on anticoagulation.  EKG from the current admission shows sinus rhythm. Will not reinitiate amiodarone at this time since compliance is poor.  We will recommend that he follow-up with his cardiologist in [facility] and discuss these issues.  Diabetes mellitus type 2, controlled HbA1c 4.9.  Holding oral agents.  SSI was discontinued.  CBGs have improved.  He is tolerating his diet.  Stop IV fluids.  Essential hypertension Currently noted to be on amlodipine lisinopril and metoprolol.  Blood pressure reasonably well controlled.  Will not be too aggressive with blood pressure control at this time.  Hyperlipidemia Noted to be on statin.  Microcytic anemia Mild drop in hemoglobin noted likely dilutional.  No evidence for overt bleeding.  Anemia panel reviewed.  Ferritin 39, TIBC 249, iron 26, percent saturation 10.  Vitamin Q22 979 folic acid 89.2.  Macrocytosis is noted.  There could be an element of iron deficiency from a component of anemia of chronic disease.  Will need iron supplementation at discharge.  May need further work-up for anemia in the outpatient setting. Recheck labs tomorrow since he will be started back on aspirin and Plavix today.  Paraplegia This is secondary to spinal stenosis.  Long history of same, ongoing for 20+ years.  Hypokalemia Repleted.   Concern for air under the diaphragm Abdominal films showed that this finding is due to gas in the bowels.  Abdomen is benign on examination.  Bowel regimen. No further work-up at this time.   DVT Prophylaxis: Lovenox Code Status: Full code Family Communication: Discussed with patient Disposition Plan: Anticipate he will be able to go back home in the next 24 to 48 hours.  Status is: Inpatient Remains inpatient appropriate because: Need for IV antibiotics, possible need for surgical  intervention      Medications: Scheduled:  amLODipine  10 mg Oral Daily   vitamin C  1,000 mg Oral Daily   aspirin EC  81 mg Oral Daily   collagenase   Topical Daily   docusate sodium  100 mg Oral Daily   enoxaparin (LOVENOX) injection  40 mg Subcutaneous Daily   leptospermum manuka honey  1 Application Topical Daily   lisinopril  10 mg Oral BID   metoprolol tartrate  50 mg Oral BID   nutrition supplement (JUVEN)  1 packet Oral BID BM   pantoprazole  40 mg Oral Daily   rosuvastatin  10 mg Oral Once per day on Mon Thu   zinc sulfate  220 mg Oral Daily   Continuous:  sodium chloride 75 mL/hr at 06/27/22 2000   ceFEPime (MAXIPIME) IV 2 g (06/28/22 0611)   dextrose 5 % and 0.45% NaCl Stopped (06/27/22 2000)   magnesium sulfate bolus IVPB     vancomycin 750 mg (06/28/22 0701)   JJH:ERDEYCXKGYJEH, alum & mag hydroxide-simeth, bisacodyl, guaiFENesin-dextromethorphan, hydrALAZINE, hydrALAZINE, HYDROmorphone (DILAUDID) injection, labetalol, magnesium citrate, magnesium sulfate bolus IVPB, metoprolol tartrate, ondansetron, oxyCODONE, oxyCODONE, oxyCODONE-acetaminophen, phenol, polyethylene glycol, potassium chloride  Antibiotics: Anti-infectives (From admission, onward)    Start     [employer]  Frequency Ordered Stop   06/27/22 1630  ceFAZolin (ANCEF) IVPB 2g/100 mL premix  Status:  Discontinued        2 g 200 mL/hr over 30 Minutes Intravenous On call to O.R. 06/27/22 1524 06/27/22 1823   06/27/22 1526  ceFAZolin (ANCEF) 2-4 GM/100ML-% IVPB       Note to Pharmacy: [facility], [place]: cabinet override      06/27/22 1526 06/28/22 0344   06/26/22 0000  ceFEPIme (MAXIPIME) 2 g in sodium chloride 0.9 % 100 mL IVPB        2 g 200 mL/hr over 30 Minutes Intravenous Every 8 hours 06/25/22 2354     06/23/22 1200  vancomycin (VANCOREADY) IVPB 750 mg/150 mL        750 mg 150 mL/hr over 60 Minutes Intravenous Every 12 hours 06/23/22 0528     06/23/22 0615  ceFEPIme (MAXIPIME) 2 g in sodium  chloride 0.9 % 100 mL IVPB  Status:  Discontinued        2 g 200 mL/hr over 30 Minutes Intravenous Every 8 hours 06/23/22 0526 06/25/22 2354   06/22/22 2145  vancomycin (VANCOREADY) IVPB 2000 mg/400 mL  Status:  Discontinued        2,000 mg 200 mL/hr over 120 Minutes Intravenous  Once 06/22/22 2134 06/22/22 2138   06/22/22 2145  cefTRIAXone (ROCEPHIN) 2 g in sodium chloride 0.9 % 100 mL IVPB        2 g 200 mL/hr over 30 Minutes Intravenous  Once 06/22/22 2134 06/22/22 2330   06/22/22 2145  vancomycin (VANCOCIN) IVPB 1000 mg/200 mL premix       See Hyperspace for full Linked Orders Report.   1,000 mg 200 mL/hr over 60 Minutes Intravenous  Once 06/22/22 2137 06/23/22 0057   06/22/22 2145  vancomycin (VANCOCIN) IVPB 1000 mg/200 mL premix       See Hyperspace for full Linked Orders Report.   1,000 mg 200 mL/hr over 60 Minutes Intravenous  Once 06/22/22 2137 06/23/22 0057       Objective:  Vital Signs  Vitals:   06/27/22 2334 06/28/22 0000 06/28/22 0400 06/28/22 0726  BP: (!) 133/91 (!) 145/84 (!) 143/86 (!) 154/88  Pulse:  94 84 85  Resp:  18 18 19   Temp:  98 F (36.7 C) 98 F (36.7 C) 98.2 F (36.8 C)  TempSrc:   Oral Oral  SpO2:  100% 100% 100%  Weight:      Height:        Intake/Output Summary (Last 24 hours) at 06/28/2022 0942 Last data filed at 06/28/2022 0300 Gross per 24 hour  Intake 1473.65 ml  Output 1750 ml  Net -276.35 ml    Filed Weights   06/23/22 0145  Weight: 83.1 kg    General appearance: Awake alert.  In no distress Resp: Clear to auscultation bilaterally.  Normal effort Cardio: S1-S2 is normal regular.  No S3-S4.  No rubs murmurs or bruit GI: Abdomen is soft.  Nontender nondistended.  Bowel sounds are present normal.  No masses organomegaly Extremities: Right lower extremity stump with wound VAC      Lab Results:  Data Reviewed: I have personally reviewed following labs and reports of the imaging studies  CBC: Recent Labs  Lab  06/22/22 2006 06/23/22 0831 06/24/22 0034 06/26/22 0123 06/28/22 0129  WBC 8.9 9.0 7.6 8.4 9.3  NEUTROABS 6.7  --   --   --   --   HGB 9.9* 8.6* 8.5* 8.3* 8.3*  HCT 32.3* 28.0* 27.4* 26.2* 27.1*  MCV 73.7* 73.7* 72.9* 72.8* 73.2*  PLT 391 336 294 309 306     Basic Metabolic Panel: Recent Labs  Lab 06/22/22 2006 06/23/22 0831 06/24/22 0034 06/26/22 0123 06/28/22 0129  NA 136 138 136 137 136  K 3.3* 3.0* 3.7 3.4* 4.2  CL 97* 104 103 106 103  CO2 28 29 26 25 26   GLUCOSE 115* 85 83 75 144*  BUN 17 10 10 10 10   CREATININE 0.99 0.82 0.79 0.79 0.98  CALCIUM 8.8* 8.2* 8.7* 8.9 9.1  MG  --   --  1.8  --   --      GFR: Estimated Creatinine Clearance: 86 mL/min (by C-G formula based on SCr of 0.98 mg/dL).  Liver Function Tests: Recent Labs  Lab 06/22/22 2006  AST 18  ALT 14  ALKPHOS 84  BILITOT 0.5  PROT 8.2*  ALBUMIN 3.1*      Coagulation Profile: Recent Labs  Lab 06/22/22 2006  INR 1.1       CBG: Recent Labs  Lab 06/27/22 1734 06/27/22 1830 06/28/22 0024 06/28/22 0606 06/28/22 0728  GLUCAP 86 92 187* 106* 100*      Recent Results (from the past 240 hour(s))  Culture, blood (Routine x 2)     Status: None   Collection Time: 06/22/22  8:50 PM   Specimen: BLOOD RIGHT FOREARM  Result Value Ref Range Status   Specimen Description   Final    BLOOD RIGHT FOREARM BLOOD Performed at [facility], [address] [place], [place] [address]    Special Requests   Final    Blood Culture adequate volume BOTTLES DRAWN AEROBIC AND ANAEROBIC Performed at [facility], [address] [place], [place] [address]    Culture   Final    NO GROWTH 5 DAYS Performed at [facility], [place] [address] [place], [place] [address]    Report Status 06/27/2022 FINAL  Final  Culture, blood (Routine x 2)     Status: None   Collection Time: 06/22/22  8:59 PM   Specimen: Right Antecubital; Blood  Result Value Ref Range Status   Specimen  Description   Final    RIGHT ANTECUBITAL BLOOD Performed at [facility], [address] [place], [place] [address]    Special Requests   Final    Blood Culture adequate volume BOTTLES DRAWN AEROBIC AND ANAEROBIC Performed at [facility], [address] [place], [place] [address]    Culture   Final    NO GROWTH 5 DAYS Performed at [facility], [place] [address], [place], [place] [address]    Report Status 06/27/2022 FINAL  Final  Surgical pcr screen     Status: None   Collection Time: 06/26/22  6:03 PM   Specimen: Nasal Mucosa; Nasal Swab  Result Value Ref Range Status   MRSA, PCR NEGATIVE NEGATIVE Final   Staphylococcus aureus NEGATIVE NEGATIVE Final    Comment: (NOTE) The Xpert SA Assay (FDA approved for NASAL specimens in patients 55 years of age and older), is one component of a comprehensive surveillance program. It is not intended to diagnose infection nor to guide or monitor treatment. Performed at [facility], [place] [address], [place], [place] [address]       Radiology Studies: ECHOCARDIOGRAM COMPLETE  Result Date: 06/27/2022    ECHOCARDIOGRAM REPORT   Patient Name:  Jeffrey Fields Date of Exam: 06/26/2022 Medical Rec #:  [id number]        Height:       75.5 in Accession #:    [id number]       Weight:       183.2 lb Date of Birth:  January 16, 1956        BSA:          2.125 m Patient Age:    66 years         BP:           139/86 mmHg Patient Gender: M                HR:           88 bpm. Exam Location:  Inpatient Procedure: 2D Echo Indications:    pre-operative evaluation  History:        Patient has no prior history of Echocardiogram examinations.                 Signs/Symptoms:Edema; Risk Factors:Diabetes, Hypertension and                 Dyslipidemia.  Sonographer:    Johny Chess RDCS Referring Phys: [phone] [facility]  Sonographer Comments: Suboptimal subcostal window. IMPRESSIONS  1. Left ventricular ejection fraction, by estimation,  is 35 to 40%. The left ventricle has moderately decreased function. The left ventricle demonstrates global hypokinesis. Left ventricular diastolic parameters are consistent with Grade I diastolic dysfunction (impaired relaxation).  2. Right ventricular systolic function is normal. The right ventricular size is normal.  3. Left atrial size was mildly dilated.  4. The mitral valve is normal in structure. Mild mitral valve regurgitation. No evidence of mitral stenosis.  5. The aortic valve was not well visualized. Aortic valve regurgitation is not visualized. No aortic stenosis is present.  6. The inferior vena cava is normal in size with greater than 50% respiratory variability, suggesting right atrial pressure of 3 mmHg. FINDINGS  Left Ventricle: Left ventricular ejection fraction, by estimation, is 35 to 40%. The left ventricle has moderately decreased function. The left ventricle demonstrates global hypokinesis. The left ventricular internal cavity size was normal in size. There is no left ventricular hypertrophy. Left ventricular diastolic parameters are consistent with Grade I diastolic dysfunction (impaired relaxation). Right Ventricle: The right ventricular size is normal. No increase in right ventricular wall thickness. Right ventricular systolic function is normal. Left Atrium: Left atrial size was mildly dilated. Right Atrium: Right atrial size was normal in size. Pericardium: There is no evidence of pericardial effusion. Mitral Valve: The mitral valve is normal in structure. Mild mitral valve regurgitation. No evidence of mitral valve stenosis. Tricuspid Valve: The tricuspid valve is normal in structure. Tricuspid valve regurgitation is not demonstrated. No evidence of tricuspid stenosis. Aortic Valve: The aortic valve was not well visualized. Aortic valve regurgitation is not visualized. No aortic stenosis is present. Pulmonic Valve: The pulmonic valve was normal in structure. Pulmonic valve regurgitation  is not visualized. No evidence of pulmonic stenosis. Aorta: The aortic root is normal in size and structure. Venous: The inferior vena cava is normal in size with greater than 50% respiratory variability, suggesting right atrial pressure of 3 mmHg. IAS/Shunts: No atrial level shunt detected by color flow Doppler.  LEFT VENTRICLE PLAX 2D LVIDd:         5.10 cm      Diastology LVIDs:         3.90 cm  LV e' medial:  5.22 cm/s LV PW:         1.00 cm      LV e' lateral: 6.53 cm/s LV IVS:        1.10 cm LVOT diam:     2.10 cm LV SV:         61 LV SV Index:   29 LVOT Area:     3.46 cm  LV Volumes (MOD) LV vol d, MOD A4C: 140.0 ml LV vol s, MOD A4C: 82.6 ml LV SV MOD A4C:     140.0 ml RIGHT VENTRICLE RV S prime:     17.00 cm/s TAPSE (M-mode): 2.7 cm LEFT ATRIUM             Index        RIGHT ATRIUM           Index LA diam:        3.60 cm 1.69 cm/m   RA Area:     17.50 cm LA Vol (A2C):   72.9 ml 34.31 ml/m  RA Volume:   45.40 ml  21.37 ml/m LA Vol (A4C):   67.2 ml 31.63 ml/m LA Biplane Vol: 72.1 ml 33.94 ml/m  AORTIC VALVE LVOT Vmax:   104.00 cm/s LVOT Vmean:  70.000 cm/s LVOT VTI:    0.177 m  AORTA Ao Root diam: 3.50 cm  SHUNTS Systemic VTI:  0.18 m Systemic Diam: 2.10 cm Candee Furbish MD Electronically signed by Candee Furbish MD Signature Date/Time: 06/27/2022/6:29:11 AM    Final        LOS: 5 days   [facility] Pager on [email or web address]  06/28/2022, 9:42 AM

## 2022-06-28 NOTE — Evaluation (Signed)
Occupational Therapy Evaluation Patient Details Name: Jeffrey Fields MRN: [id number] DOB: 1955/10/29 Today's Date: 06/28/2022   History of Present Illness 66 yo male presenting to ED on 9/15 with worsening wounds at buttock and RLE. 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 PMH including type 2 diabetes, hypertension, hyperlipidemia, paraplegia secondary to spinal stenosis.   Clinical Impression   PTA, pt was living alone and required assistance for ADLs at bed level and tranfers to/from wheelchair. Pt and family reporting he hasnt been out of bed often after recent stent placement in Feb. Pt currently requiring Mod A for UB ADLs, Max A for LB ADLs, and Mod A +2 for lateral scoot to drop arm recliner. Pt presenting with decreased sitting balance with posterior lean and requiring cues throughout for forward weight shift. Pt would benefit from further acute OT to facilitate safe dc. Recommend dc to AIR for further OT to optimize safety, independence with ADLs, and return to PLOF.      Recommendations for follow up therapy are one component of a multi-disciplinary discharge planning process, led by the attending physician.  Recommendations may be updated based on patient status, additional functional criteria and insurance authorization.   Follow Up Recommendations  Acute inpatient rehab (3hours/day)    Assistance Recommended at Discharge Frequent or constant Supervision/Assistance  Patient can return home with the following A lot of help with walking and/or transfers;A lot of help with bathing/dressing/bathroom    Functional Status Assessment  Patient has had a recent decline in their functional status and demonstrates the ability to make significant improvements in function in a reasonable and predictable amount of time.  Equipment Recommendations  None recommended by OT    Recommendations for Other Services       Precautions / Restrictions Precautions Precautions: Fall Restrictions Weight Bearing  Restrictions: Yes RLE Weight Bearing: Non weight bearing      Mobility Bed Mobility Overal bed mobility: Needs Assistance Bed Mobility: Supine to Sit     Supine to sit: Min assist, +2 for physical assistance, HOB elevated     General bed mobility comments: Min A for elevating trunk adn bringing hips towards EOB with pad    Transfers Overall transfer level: Needs assistance   Transfers: Bed to chair/wheelchair/BSC            Lateral/Scoot Transfers: Mod assist, +2 physical assistance General transfer comment: lateral scoot to drop arm recliner. Mod A +2 with use of pad to facilitate hips over. Cues thorughout for forward weight shift of trunk.      Balance Overall balance assessment: Needs assistance Sitting-balance support: Bilateral upper extremity supported, Feet supported Sitting balance-Leahy Scale: Poor                                     ADL either performed or assessed with clinical judgement   ADL Overall ADL's : Needs assistance/impaired Eating/Feeding: Set up;Supervision/ safety;Sitting   Grooming: Supervision/safety;Set up;Sitting (with support)   Upper Body Bathing: Minimal assistance;Sitting;Moderate assistance Upper Body Bathing Details (indicate cue type and reason): Reliant on UE for support in sitting. Min-Mod A for support while using hands Lower Body Bathing: Maximal assistance;Sitting/lateral leans;Bed level   Upper Body Dressing : Minimal assistance;Moderate assistance;Sitting Upper Body Dressing Details (indicate cue type and reason): Reliant on UE for support in sitting. Min-Mod A for support while using hands Lower Body Dressing: Maximal assistance;Sitting/lateral leans;Bed level   Toilet Transfer: Moderate  assistance;+2 for physical assistance;+2 for safety/equipment;Transfer board Toilet Transfer Details (indicate cue type and reason): lateral scoot to drop arm recliner. Mod A +2 with use of pad to facilitate hips over. Cues  thorughout for forward weight shift of trunk.         Functional mobility during ADLs: Moderate assistance;+2 for physical assistance (lateral scoot) General ADL Comments: Pt demonstrating decreased balance and safety awareness     Vision         Perception     Praxis      Pertinent Vitals/Pain Pain Assessment Pain Assessment: Faces Faces Pain Scale: Hurts little more Pain Location: RLE Pain Descriptors / Indicators: Discomfort Pain Intervention(s): Monitored during session, Limited activity within patient's tolerance, Repositioned     Hand Dominance     Extremity/Trunk Assessment Upper Extremity Assessment Upper Extremity Assessment: Overall WFL for tasks assessed   Lower Extremity Assessment Lower Extremity Assessment: Defer to PT evaluation       Communication Communication Communication: No difficulties   Cognition Arousal/Alertness: Awake/alert Behavior During Therapy: WFL for tasks assessed/performed Overall Cognitive Status: Impaired/Different from baseline Area of Impairment: Awareness                           Awareness: Emergent   General Comments: Decreased awareness of posterior lean and requiring cues throughout. Poor safety awareness. Feel this is close to baseline     General Comments  Daughter and wife presenting at begining of session to collect home information    Exercises     Shoulder Instructions      Home Living Family/patient expects to be discharged to:: Private residence Living Arrangements: Alone Available Help at Discharge: Family;Personal care attendant (Daughter, "wife?", and aide) Type of Home: Apartment (first floor) Home Access: Level entry     Home Layout: One level               Home Equipment: BSC/3in1;Wheelchair - manual (hospital bed)   Additional Comments: CNA coming 8-1 each day. Daughter stays in afternoons after picking up her son from school.      Prior Functioning/Environment Prior  Level of Function : Needs assist             Mobility Comments: Has decreased OOB activity tsince stent placement in Feb. Aide assists in transfers to OOB to w/c in preparation for doctors appoint. ADLs Comments: Aide assisting with all ADLs at bed level or wheel chair. Using transportation to doctor's appointments        OT Problem List: Decreased strength;Decreased range of motion;Decreased activity tolerance;Impaired balance (sitting and/or standing);Decreased safety awareness;Decreased knowledge of use of DME or AE;Decreased knowledge of precautions;Decreased cognition;Pain      OT Treatment/Interventions: Self-care/ADL training;Therapeutic exercise;Energy conservation;DME and/or AE instruction;Therapeutic activities;Patient/family education    OT Goals(Current goals can be found in the care plan section) Acute Rehab OT Goals Patient Stated Goal: Stop pain OT Goal Formulation: With patient Time For Goal Achievement: 07/12/22 Potential to Achieve Goals: Good  OT Frequency: Min 2X/week    Co-evaluation              AM-PAC OT "6 Clicks" Daily Activity     Outcome Measure Help from another person eating meals?: None Help from another person taking care of personal grooming?: A Little Help from another person toileting, which includes using toliet, bedpan, or urinal?: A Lot Help from another person bathing (including washing, rinsing, drying)?: A Lot Help from another person to  put on and taking off regular upper body clothing?: A Lot Help from another person to put on and taking off regular lower body clothing?: A Lot 6 Click Score: 15   End of Session Nurse Communication: Mobility status  Activity Tolerance: Patient tolerated treatment well Patient left: in chair;with call bell/phone within reach;with chair alarm set  OT Visit Diagnosis: Unsteadiness on feet (R26.81);Other abnormalities of gait and mobility (R26.89);Muscle weakness (generalized) (M62.81);Pain Pain -  Right/Left: Right Pain - part of body: Leg                Time: 1696-7893 OT Time Calculation (min): 39 min Charges:  OT General Charges $OT Visit: 1 Visit OT Evaluation $OT Eval Moderate Complexity: 1 Mod OT Treatments $Self Care/Home Management : 8-22 mins    MSOT, OTR/L Acute Rehab Office: [place] 06/28/2022, 1:42 PM

## 2022-06-29 DIAGNOSIS — M86271 Subacute osteomyelitis, right ankle and foot: Secondary | ICD-10-CM | POA: Diagnosis not present

## 2022-06-29 DIAGNOSIS — I10 Essential (primary) hypertension: Secondary | ICD-10-CM | POA: Diagnosis not present

## 2022-06-29 DIAGNOSIS — Z794 Long term (current) use of insulin: Secondary | ICD-10-CM

## 2022-06-29 DIAGNOSIS — D649 Anemia, unspecified: Secondary | ICD-10-CM | POA: Diagnosis not present

## 2022-06-29 DIAGNOSIS — E11621 Type 2 diabetes mellitus with foot ulcer: Secondary | ICD-10-CM

## 2022-06-29 DIAGNOSIS — E876 Hypokalemia: Secondary | ICD-10-CM

## 2022-06-29 DIAGNOSIS — L97509 Non-pressure chronic ulcer of other part of unspecified foot with unspecified severity: Secondary | ICD-10-CM

## 2022-06-29 DIAGNOSIS — E785 Hyperlipidemia, unspecified: Secondary | ICD-10-CM

## 2022-06-29 DIAGNOSIS — I70261 Atherosclerosis of native arteries of extremities with gangrene, right leg: Secondary | ICD-10-CM | POA: Diagnosis not present

## 2022-06-29 LAB — BASIC METABOLIC PANEL
Anion gap: 8 (ref 5–15)
BUN: 19 mg/dL (ref 8–23)
CO2: 23 mmol/L (ref 22–32)
Calcium: 8.9 mg/dL (ref 8.9–10.3)
Chloride: 105 mmol/L (ref 98–111)
Creatinine, Ser: 0.9 mg/dL (ref 0.61–1.24)
GFR, Estimated: >60 mL/min (ref >60)
Glucose, Bld: 97 mg/dL (ref 70–99)
Potassium: 3.2 mmol/L — ABNORMAL LOW (ref 3.5–5.1)
Sodium: 136 mmol/L (ref 135–145)

## 2022-06-29 LAB — CBC
HCT: 23.4 % — ABNORMAL LOW (ref 39.0–52.0)
Hemoglobin: 7.5 g/dL — ABNORMAL LOW (ref 13.0–17.0)
MCH: 23.2 pg — ABNORMAL LOW (ref 26.0–34.0)
MCHC: 32.1 g/dL (ref 30.0–36.0)
MCV: 72.4 fL — ABNORMAL LOW (ref 80.0–100.0)
Platelets: 279 10*3/uL (ref 150–400)
RBC: 3.23 MIL/uL — ABNORMAL LOW (ref 4.22–5.81)
RDW: 16.8 % — ABNORMAL HIGH (ref 11.5–15.5)
WBC: 8.9 10*3/uL (ref 4.0–10.5)
nRBC: 0 % (ref 0.0–0.2)

## 2022-06-29 LAB — GLUCOSE, CAPILLARY
Glucose-Capillary: 105 mg/dL — ABNORMAL HIGH (ref 70–99)
Glucose-Capillary: 114 mg/dL — ABNORMAL HIGH (ref 70–99)
Glucose-Capillary: 121 mg/dL — ABNORMAL HIGH (ref 70–99)
Glucose-Capillary: 83 mg/dL (ref 70–99)
Glucose-Capillary: 88 mg/dL (ref 70–99)

## 2022-06-29 LAB — SURGICAL PATHOLOGY

## 2022-06-29 NOTE — Progress Notes (Signed)
Occupational Therapy Treatment Patient Details Name: Jeffrey Fields MRN: [id number] DOB: Nov 27, 1955 Today's Date: 06/29/2022   History of present illness 66 yo male presenting to ED on 9/15 with worsening wounds at buttock and RLE. 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 PMH including type 2 diabetes, hypertension, hyperlipidemia, paraplegia secondary to spinal stenosis.   OT comments  Pt progressing towards established OT goals and continues to present with high motivation to participate in therapy. Pt performing bed mobility with Min A and able to achieve fair sitting balance with increased time and cues. Pt then performing lateral scoot to drop arm recliner with Mod A; cues throughout for weight shift forward and LLE placement. One episode of LOB posteriorly and requiring physical A for safety and fall prevention. Initiating education on LB dressing techniques with patient and wife. Continue to recommend dc to AIR and will continue to follow acutely as admitted.    Recommendations for follow up therapy are one component of a multi-disciplinary discharge planning process, led by the attending physician.  Recommendations may be updated based on patient status, additional functional criteria and insurance authorization.    Follow Up Recommendations  Acute inpatient rehab (3hours/day)    Assistance Recommended at Discharge Frequent or constant Supervision/Assistance  Patient can return home with the following  A lot of help with walking and/or transfers;A lot of help with bathing/dressing/bathroom   Equipment Recommendations  None recommended by OT    Recommendations for Other Services      Precautions / Restrictions Precautions Precautions: Fall Restrictions Weight Bearing Restrictions: Yes RLE Weight Bearing: Non weight bearing       Mobility Bed Mobility Overal bed mobility: Needs Assistance Bed Mobility: Supine to Sit     Supine to sit: Min assist, HOB elevated     General bed mobility  comments: Min A to elevate trunk    Transfers Overall transfer level: Needs assistance   Transfers: Bed to chair/wheelchair/BSC            Lateral/Scoot Transfers: Mod assist General transfer comment: Mod A for lateral scoot to drop arm recliner. Use of pad to bring hips towards L. Cues for foot placement and forward weight shift throughout. One episode of LOB posteriorly     Balance Overall balance assessment: Needs assistance Sitting-balance support: Feet supported, No upper extremity supported, Bilateral upper extremity supported Sitting balance-Leahy Scale: Fair Sitting balance - Comments: Initatially requiring UEs for support at EOB. Able to achieve fair sitting balance                                   ADL either performed or assessed with clinical judgement   ADL Overall ADL's : Needs assistance/impaired                       Lower Body Dressing Details (indicate cue type and reason): Initating discussion for LB dressing techniques Toilet Transfer: Moderate assistance;Transfer board;Requires drop arm Toilet Transfer Details (indicate cue type and reason): Mod A for lateral scoot to drop arm recliner. Use of pad to bring hips towards L. Cues for foot placement and forward weight shift throughout. One episode of LOB posteriorly         Functional mobility during ADLs: Moderate assistance (lateral scoot) General ADL Comments: demonstrating imroving balance and trunk control. Continues to requiring Max cues for weight shift forward.    Extremity/Trunk Assessment Upper Extremity Assessment Upper  Extremity Assessment: Overall WFL for tasks assessed   Lower Extremity Assessment Lower Extremity Assessment: Defer to PT evaluation        Vision       Perception     Praxis      Cognition Arousal/Alertness: Awake/alert Behavior During Therapy: WFL for tasks assessed/performed Overall Cognitive Status: Impaired/Different from baseline Area  of Impairment: Awareness                           Awareness: Emergent            Exercises      Shoulder Instructions       General Comments Wife present throughout    Pertinent Vitals/ Pain       Pain Assessment Pain Assessment: Faces Faces Pain Scale: Hurts little more Pain Location: RLE Pain Descriptors / Indicators: Discomfort Pain Intervention(s): Monitored during session, Premedicated before session, Repositioned  Home Living                                          Prior Functioning/Environment              Frequency  Min 2X/week        Progress Toward Goals  OT Goals(current goals can now be found in the care plan section)  Progress towards OT goals: Progressing toward goals  Acute Rehab OT Goals OT Goal Formulation: With patient Time For Goal Achievement: 07/12/22 Potential to Achieve Goals: Good ADL Goals Pt Will Perform Upper Body Dressing: with min guard assist;sitting Pt Will Perform Lower Body Dressing: with mod assist;sitting/lateral leans Pt Will Transfer to Toilet: with min assist;with +2 assist;bedside commode Pt Will Perform Toileting - Clothing Manipulation and hygiene: with min assist;sitting/lateral leans  Plan Discharge plan remains appropriate    Co-evaluation                 AM-PAC OT "6 Clicks" Daily Activity     Outcome Measure   Help from another person eating meals?: None Help from another person taking care of personal grooming?: A Little Help from another person toileting, which includes using toliet, bedpan, or urinal?: A Lot Help from another person bathing (including washing, rinsing, drying)?: A Lot Help from another person to put on and taking off regular upper body clothing?: A Lot Help from another person to put on and taking off regular lower body clothing?: A Lot 6 Click Score: 15    End of Session    OT Visit Diagnosis: Unsteadiness on feet (R26.81);Other  abnormalities of gait and mobility (R26.89);Muscle weakness (generalized) (M62.81);Pain Pain - Right/Left: Right Pain - part of body: Leg   Activity Tolerance Patient tolerated treatment well   Patient Left in chair;with call bell/phone within reach;with chair alarm set   Nurse Communication Mobility status        Time: 1103-1130 OT Time Calculation (min): 27 min  Charges: OT General Charges $OT Visit: 1 Visit OT Treatments $Self Care/Home Management : 23-37 mins    MSOT, OTR/L Acute Rehab Office: [place] 06/29/2022, 12:53 PM

## 2022-06-29 NOTE — Progress Notes (Signed)
Inpatient Rehab Coordinator Note:  I met with patient and his family at bedside to discuss CIR recommendations and goals/expectations of CIR stay.  We reviewed 3 hrs/day of therapy, physician follow up, and average length of stay 2 weeks (dependent upon progress) with goals of supervision to min assist.  Pt and family agree that they prefer direct d/c home at this time.  They feel pt is close to baseline and they have enough support to facilitate transition home.  I will sign off for now.  Please do not hesitate to reengage CIR should needs change.    Shann Medal, PT, DPT Admissions Coordinator [phone] 06/29/22  11:36 AM

## 2022-06-29 NOTE — Progress Notes (Signed)
[facility] PROGRESS NOTE   Jeffrey Fields [id number] DOB: November 18, 1955 DOA: 06/22/2022  PCP: [name], DO  Brief History/Interval Summary:  66 y.o. male with medical history significant of type 2 diabetes, hypertension, hyperlipidemia, paraplegia secondary to spinal stenosis presented to the ED with complaint of bilateral lower extremity/foot wounds.  Imaging studies raised concern for osteomyelitis involving bilateral feet.  Patient was hospitalized.  Orthopedics was consulted.    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  Soft tissue gas was also noted.   There was also concern for osteomyelitis of the right fifth proximal phalanx and the mid to distal fifth metatarsal. Patient was started on broad-spectrum antibiotics.   Ortho following - ABIs showed moderate disease of the right lower extremity.  R AKA on 9/20. Clean margins - cultures negative - DC IV abx and transition to Augmentin May not need wound vac at DC - defer to ortho  Coronary artery disease February Non-STEMI noted At outside facility in [place] PCI of LAD and left main and with 3 stents to LAD .   Placed on dual antiplatelets with aspirin and Plavix - no follow up since that time per previous discussion After 30 days of taking Plavix he could not get it refilled and did not reach out to cardiology but has been taking aspirin Resume DAPT - recommend outpt cardiology follow up as previously recommended  Ischemic cardiomyopathy/chronic systolic CHF EF previously 25 to 30%  Currently on metoprolol lisinopril.  His Wilder Glade is on  hold. Not on diuretics outpatient Echo here shows EF has improved to 35 to 40%.  Global hypokinesis noted.  Diastolic dysfunction also seen. He is supposed to be followed by Dr. Horton Finer.   Paroxysmal atrial fibrillation Was on amiodarone at the time of discharge from [facility] in February.  Does not seem to be on it anymore.  EKG shows sinus rhythm.  He is on metoprolol. It does not appear the patient was ever started on anticoagulation.  EKG from the current admission shows sinus rhythm. Will not reinitiate amiodarone at this time since compliance is poor.  We will recommend that he follow-up with his cardiologist in [facility] and discuss these issues.  Diabetes mellitus type 2, controlled HbA1c 4.9.  Holding oral agents.  SSI was discontinued.  CBGs have improved.  He is tolerating his diet.  Stop IV fluids.  Essential hypertension Currently noted to be on amlodipine lisinopril and metoprolol.  Blood pressure reasonably well controlled.  Will not be too aggressive with blood pressure control at this time.  Hyperlipidemia Noted to be on statin.  Microcytic anemia Mild drop in hemoglobin noted likely dilutional.  No evidence for overt bleeding.  Anemia panel reviewed.  Ferritin 39, TIBC 249, iron 26, percent saturation 10.  Vitamin [id number] folic acid [id number].  Macrocytosis is noted.  There could be an element of iron deficiency from a component of anemia of chronic disease.  Will need iron supplementation at discharge.  May need further work-up for anemia in the outpatient setting. Recheck labs tomorrow since he will be started back on aspirin and Plavix today.  Paraplegia This is secondary to spinal stenosis.  Long  history of same, ongoing for 20+ years.  Hypokalemia Repleted.   DVT Prophylaxis: Lovenox Code Status: Full code Family Communication: Discussed with patient Disposition Plan: Anticipate he will be able to go back home in the next 24 to 48  hours.  Status is: Inpatient Remains inpatient appropriate because: Need for IV antibiotics, possible need for surgical intervention  Medications: Scheduled:  amLODipine  10 mg Oral Daily   amoxicillin-clavulanate  1 tablet Oral Q12H   vitamin C  1,000 mg Oral Daily   aspirin EC  81 mg Oral Daily   clopidogrel  75 mg Oral Daily   collagenase   Topical Daily   docusate sodium  100 mg Oral Daily   enoxaparin (LOVENOX) injection  40 mg Subcutaneous Daily   leptospermum manuka honey  1 Application Topical Daily   lisinopril  10 mg Oral BID   metoprolol tartrate  50 mg Oral BID   nutrition supplement (JUVEN)  1 packet Oral BID BM   pantoprazole  40 mg Oral Daily   rosuvastatin  10 mg Oral Once per day on Mon Thu   zinc sulfate  220 mg Oral Daily   Continuous:  magnesium sulfate bolus IVPB     HTX:HFSFSELTRVUYE, alum & mag hydroxide-simeth, bisacodyl, guaiFENesin-dextromethorphan, hydrALAZINE, hydrALAZINE, HYDROmorphone (DILAUDID) injection, labetalol, magnesium citrate, magnesium sulfate bolus IVPB, metoprolol tartrate, ondansetron, oxyCODONE, oxyCODONE, oxyCODONE-acetaminophen, phenol, polyethylene glycol, potassium chloride  Antibiotics: Anti-infectives (From admission, onward)    Start     Dose/Rate Route Frequency Ordered Stop   06/28/22 1045  amoxicillin-clavulanate (AUGMENTIN) 875-125 MG per tablet 1 tablet        1 tablet Oral Every 12 hours 06/28/22 0952     06/27/22 1630  ceFAZolin (ANCEF) IVPB 2g/100 mL premix  Status:  Discontinued        2 g 200 mL/hr over 30 Minutes Intravenous On call to O.R. 06/27/22 1524 06/27/22 1823   06/27/22 1526  ceFAZolin (ANCEF) 2-4 GM/100ML-% IVPB       Note to Pharmacy: [facility], [place]: cabinet override      06/27/22 1526 06/28/22 0344   06/26/22 0000  ceFEPIme (MAXIPIME) 2 g in sodium chloride 0.9 % 100 mL IVPB  Status:  Discontinued        2 g 200 mL/hr over 30 Minutes Intravenous Every 8 hours 06/25/22 2354 06/28/22 0952   06/23/22 1200   vancomycin (VANCOREADY) IVPB 750 mg/150 mL  Status:  Discontinued        750 mg 150 mL/hr over 60 Minutes Intravenous Every 12 hours 06/23/22 0528 06/28/22 0952   06/23/22 0615  ceFEPIme (MAXIPIME) 2 g in sodium chloride 0.9 % 100 mL IVPB  Status:  Discontinued        2 g 200 mL/hr over 30 Minutes Intravenous Every 8 hours 06/23/22 0526 06/25/22 2354   06/22/22 2145  vancomycin (VANCOREADY) IVPB 2000 mg/400 mL  Status:  Discontinued        2,000 mg 200 mL/hr over 120 Minutes Intravenous  Once 06/22/22 2134 06/22/22 2138   06/22/22 2145  cefTRIAXone (ROCEPHIN) 2 g in sodium chloride 0.9 % 100 mL IVPB        2 g 200 mL/hr over 30 Minutes Intravenous  Once 06/22/22 2134 06/22/22 2330   06/22/22 2145  vancomycin (VANCOCIN) IVPB 1000 mg/200 mL premix       See Hyperspace for full Linked Orders Report.   1,000 mg 200 mL/hr over 60 Minutes Intravenous  Once 06/22/22 2137 06/23/22 0057  06/22/22 2145  vancomycin (VANCOCIN) IVPB 1000 mg/200 mL premix       See Hyperspace for full Linked Orders Report.   1,000 mg 200 mL/hr over 60 Minutes Intravenous  Once 06/22/22 2137 06/23/22 0057       Objective:  Vital Signs  Vitals:   06/28/22 0400 06/28/22 0726 06/28/22 1316 06/28/22 1929  BP: (!) 143/86 (!) 154/88 139/82 127/79  Pulse: 84 85 94 92  Resp: 18 19 18 12   Temp: 98 F (36.7 C) 98.2 F (36.8 C) 98.8 F (37.1 C) 98.3 F (36.8 C)  TempSrc: Oral Oral  Oral  SpO2: 100% 100% 100% 99%  Weight:      Height:        Intake/Output Summary (Last 24 hours) at 06/29/2022 0749 Last data filed at 06/29/2022 0200 Gross per 24 hour  Intake 450 ml  Output 1700 ml  Net -1250 ml    Filed Weights   06/23/22 0145  Weight: 83.1 kg    General appearance: Awake alert.  In no distress Resp: Clear to auscultation bilaterally.  Normal effort Cardio: S1-S2 is normal regular.  No S3-S4.  No rubs murmurs or bruit GI: Abdomen is soft.  Nontender nondistended.  Bowel sounds are present normal.  No  masses organomegaly Extremities: Right lower extremity stump with wound VAC intact   Lab Results:  Data Reviewed: I have personally reviewed following labs and reports of the imaging studies  CBC: Recent Labs  Lab 06/22/22 2006 06/23/22 0831 06/24/22 0034 06/26/22 0123 06/28/22 0129 06/29/22 0057  WBC 8.9 9.0 7.6 8.4 9.3 8.9  NEUTROABS 6.7  --   --   --   --   --   HGB 9.9* 8.6* 8.5* 8.3* 8.3* 7.5*  HCT 32.3* 28.0* 27.4* 26.2* 27.1* 23.4*  MCV 73.7* 73.7* 72.9* 72.8* 73.2* 72.4*  PLT 391 336 294 309 306 279     Basic Metabolic Panel: Recent Labs  Lab 06/23/22 0831 06/24/22 0034 06/26/22 0123 06/28/22 0129 06/29/22 0057  NA 138 136 137 136 136  K 3.0* 3.7 3.4* 4.2 3.2*  CL 104 103 106 103 105  CO2 29 26 25 26 23   GLUCOSE 85 83 75 144* 97  BUN 10 10 10 10 19   CREATININE 0.82 0.79 0.79 0.98 0.90  CALCIUM 8.2* 8.7* 8.9 9.1 8.9  MG  --  1.8  --   --   --      GFR: Estimated Creatinine Clearance: 93.6 mL/min (by C-G formula based on SCr of 0.9 mg/dL).  Liver Function Tests: Recent Labs  Lab 06/22/22 2006  AST 18  ALT 14  ALKPHOS 84  BILITOT 0.5  PROT 8.2*  ALBUMIN 3.1*      Coagulation Profile: Recent Labs  Lab 06/22/22 2006  INR 1.1       CBG: Recent Labs  Lab 06/28/22 0728 06/28/22 1137 06/28/22 1548 06/29/22 0013 06/29/22 0654  GLUCAP 100* 116* 127* 114* 83      Recent Results (from the past 240 hour(s))  Culture, blood (Routine x 2)     Status: None   Collection Time: 06/22/22  8:50 PM   Specimen: BLOOD RIGHT FOREARM  Result Value Ref Range Status   Specimen Description   Final    BLOOD RIGHT FOREARM BLOOD Performed at [facility], [address] [place], [place] [address]    Special Requests   Final    Blood Culture adequate volume BOTTLES DRAWN AEROBIC AND ANAEROBIC Performed  at [facility], [address] [place], [place] [address]    Culture   Final    NO GROWTH 5 DAYS Performed at [facility], [place] [address] [place], [place] [address]    Report Status 06/27/2022 FINAL  Final  Culture, blood (Routine x 2)     Status: None   Collection Time: 06/22/22  8:59 PM   Specimen: Right Antecubital; Blood  Result Value Ref Range Status   Specimen Description   Final    RIGHT ANTECUBITAL BLOOD Performed at [facility], [address] [place], [place] [address]    Special Requests   Final    Blood Culture adequate volume BOTTLES DRAWN AEROBIC AND ANAEROBIC Performed at [facility], [address], [place], [place] [address]    Culture   Final    NO GROWTH 5 DAYS Performed at [facility], [place] [address], [place], [place] [address]    Report Status 06/27/2022 FINAL  Final  Surgical pcr screen     Status: None   Collection Time: 06/26/22  6:03 PM   Specimen: Nasal Mucosa; Nasal Swab  Result Value Ref Range Status   MRSA, PCR NEGATIVE NEGATIVE Final   Staphylococcus aureus NEGATIVE NEGATIVE Final    Comment: (NOTE) The Xpert SA Assay (FDA approved for NASAL specimens in patients 67 years of age and older), is one component of a comprehensive surveillance program. It is not intended to diagnose infection nor to guide or monitor treatment. Performed at [facility], [place] [address] [place],  [address]       Radiology Studies: No results found.     LOS: 6 days   [facility] Pager on [email or web address]  06/29/2022, 7:49 AM

## 2022-06-29 NOTE — Progress Notes (Signed)
Patient ID: Jeffrey Fields, male   DOB: 1956/02/08, 66 y.o.   MRN: [id number] Patient is postoperative day 2 right above-knee amputation.  Patient states that his ischemic pain is resolved. Conversant and comfortable.  There is no drainage in the wound VAC canister, however, there is not a good enough seal to discharge on the portable Praveena pump.  Transition to dry dressing change at discharge.  Suzan Slick, NP [phone]

## 2022-06-29 NOTE — Plan of Care (Signed)

## 2022-06-29 NOTE — Progress Notes (Signed)
   06/29/22 0750  Assess: MEWS Score  Temp 99.1 F (37.3 C)  BP 137/82  MAP (mmHg) 100  Pulse Rate (!) 116  ECG Heart Rate (!) 121  Resp 18  Assess: MEWS Score  MEWS Temp 0  MEWS Systolic 0  MEWS Pulse 2  MEWS RR 0  MEWS LOC 0  MEWS Score 2  MEWS Score Color Yellow  Assess: if the MEWS score is Yellow or Red  Were vital signs taken at a resting state? Yes  Focused Assessment No change from prior assessment  MEWS guidelines implemented *See Row Information* No, vital signs rechecked  Treat  MEWS Interventions Administered scheduled meds/treatments  Take Vital Signs  Increase Vital Sign Frequency  Yellow: Q 2hr X 2 then Q 4hr X 2, if remains yellow, continue Q 4hrs  Escalate  MEWS: Escalate Yellow: discuss with charge nurse/RN and consider discussing with provider and RRT  Notify: Charge Nurse/RN  Name of Charge Nurse/RN Notified Jade,RN  Date Charge Nurse/RN Notified 06/29/22  Time Charge Nurse/RN Notified 0805  Notify: Provider  Provider Name/Title Holli Humbles  Date Provider Notified 06/29/22  Time Provider Notified [phone]  Method of Notification Face-to-face  Notification Reason Other (Comment)  Provider response No new orders  Date of Provider Response 06/29/22  Time of Provider Response 0815  Assess: SIRS CRITERIA  SIRS Temperature  0  SIRS Pulse 1  SIRS Respirations  0  SIRS WBC 1  SIRS Score Sum  2

## 2022-06-29 NOTE — TOC Initial Note (Addendum)
Transition of Care [facility]) - Initial/Assessment Note    Patient Details  Name: Jeffrey Fields MRN: [id number] Date of Birth: 1956/05/15  Transition of Care [facility] CM/SW Contact:    Sharin Mons, RN Phone Number: 06/29/2022, 3:52 PM  Clinical Narrative:     Admitted with   9/15 with worsening wounds at buttock and RLE.     -S/p R AKA,9/20           NCM spoke with pt @ bedside regarding TOC needs. Resides with daughter and grandson. Pt states he receives  PCS hrs daily (8am -3pm) provided by [employer] PTA. States daughter assist with care in the evening.NCM shared  IRC note : Pt and family agree that they prefer direct d/c home at this time. They feel pt is close to baseline and they have enough support to facilitate transition home. Pt confirmed and stated he has all the help needed @ home. Pt declined home health services, adamantly.  Pt without DME noted. Has hospital bed and W/C @ HOME. States will need non emergent ambulance transportation to home once d/c ready.  Pt states has no problems affording Rx meds.   TOC  team following for needs....  Expected Discharge Plan: Home/Self Care (pt declined home health services) Barriers to Discharge: Continued Medical Work up   Patient Goals and CMS Choice        Expected Discharge Plan and Services Expected Discharge Plan: Home/Self Care (pt declined home health services)       Living arrangements for the past 2 months: Single Family Home                                      Prior Living Arrangements/Services Living arrangements for the past 2 months: Single Family Home Lives with:: Adult Children (daughter and grandson) Patient language and need for interpreter reviewed:: Yes Do you feel safe going back to the place where you live?: Yes      Need for Family Participation in Patient Care: Yes (Comment) Care giver support system in place?: Yes (comment) Current home services: DME (hospital bed,  W/C) Criminal Activity/Legal Involvement Pertinent to Current Situation/Hospitalization: No - Comment as needed  Activities of Daily Living Home Assistive Devices/Equipment: Wheelchair ADL Screening (condition at time of admission) Patient's cognitive ability adequate to safely complete daily activities?: Yes Is the patient deaf or have difficulty hearing?: No Does the patient have difficulty seeing, even when wearing glasses/contacts?: No Does the patient have difficulty concentrating, remembering, or making decisions?: No Patient able to express need for assistance with ADLs?: Yes Does the patient have difficulty dressing or bathing?: Yes Independently performs ADLs?: No Communication: Independent Dressing (OT): Needs assistance Is this a change from baseline?: Pre-admission baseline Grooming: Needs assistance Is this a change from baseline?: Pre-admission baseline Feeding: Independent Bathing: Needs assistance Is this a change from baseline?: Pre-admission baseline Toileting: Needs assistance Is this a change from baseline?: Pre-admission baseline In/Out Bed: Needs assistance Is this a change from baseline?: Pre-admission baseline Walks in Home: Needs assistance Is this a change from baseline?: Pre-admission baseline Does the patient have difficulty walking or climbing stairs?: Yes Weakness of Legs: Both Weakness of Arms/Hands: None  Permission Sought/Granted   Permission granted to share information with : Yes, Verbal Permission Granted  Share Information with NAME: [name]  Daughter  [phone]  Emotional Assessment Appearance:: Appears stated age Attitude/Demeanor/Rapport: Gracious Affect (typically observed): Accepting Orientation: : Oriented to Self, Oriented to Place, Oriented to  Time, Oriented to Situation Alcohol / Substance Use: Not Applicable Psych Involvement: No (comment)  Admission diagnosis:  Osteomyelitis ([place]) [M86.9] Other acute  osteomyelitis of foot, unspecified laterality [facility]) [M86.179] Patient Active Problem List   Diagnosis Date Noted   Atherosclerosis of native arteries of extremities with gangrene, right leg ([place])    Lower extremity ulceration ([place]) 06/23/2022   Type 2 diabetes mellitus ([place]) 06/23/2022   Essential hypertension 06/23/2022   Hyperlipidemia 06/23/2022   Chronic anemia 06/23/2022   Hypokalemia 06/23/2022   Osteomyelitis ([place]) 06/22/2022   PCP:  [name], DO Pharmacy:   [place], [place] - [address] [place] [address] Phone: [phone] Fax: [phone]     Social Determinants of Health (SDOH) Interventions    Readmission Risk Interventions     No data to display

## 2022-06-29 NOTE — Care Management Important Message (Signed)
Important Message  Patient Details  Name: Jeffrey Fields MRN: [id number] Date of Birth: [birth year]   Medicare Important Message Given:  Yes     [name] 06/29/2022, 11:47 AM

## 2022-06-29 NOTE — Progress Notes (Signed)
Physical Therapy Treatment Patient Details Name: Jeffrey Fields MRN: [id number] DOB: 05-23-1956 Today's Date: 06/29/2022   History of Present Illness 66 yo male presenting to ED on 9/15 with worsening wounds at buttock and RLE. 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 PMH including type 2 diabetes, hypertension, hyperlipidemia, paraplegia secondary to spinal stenosis.    PT Comments    Patient highly motivated to progress independence for functional transfers. He required +2 Mod assist for lateral scoots back to bed with use of bed pad and cues for anterior trunk lean to facilitate hip clearance and reduce posterior lean/LOB. Seated balance exercises completed at EOB with reaching to facilitate lateral and anterior trunk weight shifts. EOS exercises for AROM and AAROM completed to Rt residual limb for hip mobility and strength. Educated on importance of hip extensor strengthening and hip flexor stretching for positioning. Will continue to progress pt as able with recommendation for intense follow up at AIR setting.    Recommendations for follow up therapy are one component of a multi-disciplinary discharge planning process, led by the attending physician.  Recommendations may be updated based on patient status, additional functional criteria and insurance authorization.  Follow Up Recommendations  Acute inpatient rehab (3hours/day)     Assistance Recommended at Discharge Frequent or constant Supervision/Assistance  Patient can return home with the following Two people to help with walking and/or transfers;A lot of help with bathing/dressing/bathroom;Assistance with cooking/housework;Assist for transportation;Help with stairs or ramp for entrance   Equipment Recommendations  None recommended by PT    Recommendations for Other Services Rehab consult     Precautions / Restrictions Precautions Precautions: Fall Restrictions Weight Bearing Restrictions: Yes RLE Weight Bearing: Non weight bearing      Mobility  Bed Mobility Overal bed mobility: Needs Assistance Bed Mobility: Sit to Supine       Sit to supine: Min assist, +2 for safety/equipment   General bed mobility comments: Min +2 to control lowering trunk and bring LE's onto bed to return to supine.    Transfers Overall transfer level: Needs assistance   Transfers: Bed to chair/wheelchair/BSC            Lateral/Scoot Transfers: Mod assist, +2 safety/equipment, +2 physical assistance General transfer comment: Mod+2 for lateral scoot chair>bed. Pt requried cues for anterior trunk lean and hand placement to guide scoots. Mod +2 with bed pad to scoot fully.    Ambulation/Gait                   Stairs             Wheelchair Mobility    Modified Rankin (Stroke Patients Only)       Balance Overall balance assessment: Needs assistance Sitting-balance support: Feet supported, No upper extremity supported, Bilateral upper extremity supported, Single extremity supported Sitting balance-Leahy Scale: Fair Sitting balance - Comments: seated balance activities completed (fluctuating support and UE support): 10x posterior lean with anterio trunk correction to facilitate core activition for postural strategies. 10x ipsilateral reaching bil UE and 10x contralteral reaching bil UE. pt has greatest difficulty reaching and leaning towards Lt side. Reaching Lt and down facilitated weight shift best.                                    Cognition Arousal/Alertness: Awake/alert Behavior During Therapy: WFL for tasks assessed/performed Overall Cognitive Status: Impaired/Different from baseline Area of Impairment: Awareness  Awareness: Emergent   General Comments: pleasant, extra time and repetition for cues.        Exercises Other Exercises Other Exercises: Rt hip extension with overpressure for stretch 10x; Rt hip flexion/extension with gentle resistance for  hip ext and overpressure stretch at end range 10x, Rt hip abd/add AAROM 10x.    General Comments General comments (skin integrity, edema, etc.): Wife present throughout      Pertinent Vitals/Pain Pain Assessment Pain Assessment: Faces Faces Pain Scale: Hurts little more Pain Location: RLE Pain Descriptors / Indicators: Discomfort Pain Intervention(s): Limited activity within patient's tolerance, Monitored during session, Repositioned    Home Living                          Prior Function            PT Goals (current goals can now be found in the care plan section) Acute Rehab PT Goals Patient Stated Goal: to go home PT Goal Formulation: With patient Time For Goal Achievement: 07/12/22 Potential to Achieve Goals: Good Progress towards PT goals: Progressing toward goals    Frequency    Min 4X/week      PT Plan Current plan remains appropriate    Co-evaluation              AM-PAC PT "6 Clicks" Mobility   Outcome Measure  Help needed turning from your back to your side while in a flat bed without using bedrails?: A Lot Help needed moving from lying on your back to sitting on the side of a flat bed without using bedrails?: Total Help needed moving to and from a bed to a chair (including a wheelchair)?: Total Help needed standing up from a chair using your arms (e.g., wheelchair or bedside chair)?: Total Help needed to walk in hospital room?: Total Help needed climbing 3-5 steps with a railing? : Total 6 Click Score: 7    End of Session Equipment Utilized During Treatment: Gait belt Activity Tolerance: Patient tolerated treatment well Patient left: in bed;with call bell/phone within reach;with bed alarm set Nurse Communication: Mobility status PT Visit Diagnosis: Muscle weakness (generalized) (M62.81)     Time: [id number] PT Time Calculation (min) (ACUTE ONLY): 33 min  Charges:  $Therapeutic Exercise: 8-22 mins $Therapeutic Activity: 8-22  mins                     Verner Mould, DPT Acute Rehabilitation Services Office [phone] Pager [phone]  06/29/22 3:40 PM

## 2022-06-30 DIAGNOSIS — I70261 Atherosclerosis of native arteries of extremities with gangrene, right leg: Secondary | ICD-10-CM | POA: Diagnosis not present

## 2022-06-30 DIAGNOSIS — D649 Anemia, unspecified: Secondary | ICD-10-CM | POA: Diagnosis not present

## 2022-06-30 DIAGNOSIS — I10 Essential (primary) hypertension: Secondary | ICD-10-CM | POA: Diagnosis not present

## 2022-06-30 DIAGNOSIS — M86271 Subacute osteomyelitis, right ankle and foot: Secondary | ICD-10-CM | POA: Diagnosis not present

## 2022-06-30 LAB — CBC
HCT: 24.6 % — ABNORMAL LOW (ref 39.0–52.0)
Hemoglobin: 7.6 g/dL — ABNORMAL LOW (ref 13.0–17.0)
MCH: 22.6 pg — ABNORMAL LOW (ref 26.0–34.0)
MCHC: 30.9 g/dL (ref 30.0–36.0)
MCV: 73.2 fL — ABNORMAL LOW (ref 80.0–100.0)
Platelets: 278 10*3/uL (ref 150–400)
RBC: 3.36 MIL/uL — ABNORMAL LOW (ref 4.22–5.81)
RDW: 16.8 % — ABNORMAL HIGH (ref 11.5–15.5)
WBC: 9.3 10*3/uL (ref 4.0–10.5)
nRBC: 0 % (ref 0.0–0.2)

## 2022-06-30 LAB — BASIC METABOLIC PANEL
Anion gap: 8 (ref 5–15)
BUN: 17 mg/dL (ref 8–23)
CO2: 26 mmol/L (ref 22–32)
Calcium: 9.1 mg/dL (ref 8.9–10.3)
Chloride: 103 mmol/L (ref 98–111)
Creatinine, Ser: 0.7 mg/dL (ref 0.61–1.24)
GFR, Estimated: >60 mL/min (ref >60)
Glucose, Bld: 104 mg/dL — ABNORMAL HIGH (ref 70–99)
Potassium: 3.1 mmol/L — ABNORMAL LOW (ref 3.5–5.1)
Sodium: 137 mmol/L (ref 135–145)

## 2022-06-30 LAB — GLUCOSE, CAPILLARY
Glucose-Capillary: 83 mg/dL (ref 70–99)
Glucose-Capillary: 84 mg/dL (ref 70–99)
Glucose-Capillary: 93 mg/dL (ref 70–99)
Glucose-Capillary: 96 mg/dL (ref 70–99)

## 2022-06-30 NOTE — Progress Notes (Signed)
[facility] PROGRESS NOTE   Jeffrey Fields [id number] DOB: July 10, 1956 DOA: 06/22/2022  PCP: [name], DO  Brief History/Interval Summary:  66 y.o. male with medical history significant of type 2 diabetes, hypertension, hyperlipidemia, paraplegia secondary to spinal stenosis presented to the ED with complaint of bilateral lower extremity/foot wounds.  Imaging studies raised concern for osteomyelitis involving bilateral feet.  Patient was hospitalized.  Orthopedics was consulted.    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  Soft tissue gas was also noted. Similar findings in right fifth proximal phalanx and the mid to distal fifth metatarsal. Patient was started on broad-spectrum antibiotics.   Ortho following - ABIs showed moderate disease of the right lower extremity.  R AKA on 9/20. Clean margins - cultures negative - DC IV abx  -continue Augmentin - tolerating well May not need wound vac at DC - defer to ortho (tentative plan for dry dressing changes at discharge)  Coronary artery disease February Non-STEMI noted At outside facility in [place] PCI of LAD and left main and with 3 stents to LAD .   Placed on dual antiplatelets with aspirin and Plavix at that time- no follow up since that time per previous discussion -unable to secure refills for his medication due to poor follow-up (only on aspirin) Resume DAPT - recommend outpt cardiology follow up as previously recommended  Ischemic cardiomyopathy/chronic systolic CHF EF previously 25 to 30%  Currently on metoprolol lisinopril.  His Wilder Glade is  on hold. Not on diuretics outpatient Echo here shows EF has improved to 35 to 40%.  Global hypokinesis noted.  Diastolic dysfunction also seen. He is supposed to be followed by Dr. Horton Finer.   Paroxysmal atrial fibrillation Was on amiodarone at the time of discharge from [facility] in February.  Does not seem to be on it anymore.  EKG shows sinus rhythm.  He is on metoprolol. It does not appear the patient was ever started on anticoagulation.  EKG from the current admission shows sinus rhythm. Will not reinitiate amiodarone at this time since compliance is poor.  We will recommend that he follow-up with his cardiologist in [facility] and discuss these issues.  Diabetes mellitus type 2, controlled HbA1c 4.9.  Holding oral agents.  SSI was discontinued.  CBGs have improved.  He is tolerating his diet.  Stop IV fluids.  Essential hypertension Currently noted to be on amlodipine lisinopril and metoprolol.  Blood pressure reasonably well controlled.  Will not be too aggressive with blood pressure control at this time.  Hyperlipidemia Noted to be on statin.  Microcytic anemia Mild drop in hemoglobin noted likely dilutional.  No evidence for overt bleeding.  Anemia panel reviewed.  Ferritin 39, TIBC 249, iron 26, percent saturation 10.  Vitamin [id number] folic acid [id number].  Macrocytosis is noted.  There could be an element of iron deficiency from a component of anemia of chronic disease.  Will need iron supplementation at discharge.  May need further work-up for anemia in the outpatient setting. Recheck labs tomorrow since he will be started back on aspirin and Plavix today.  Paraplegia This is secondary to spinal stenosis.  Long history of same, ongoing for 20+  years.  Hypokalemia Repleted.   DVT Prophylaxis: Lovenox Code Status: Full code Family Communication: Discussed with patient Disposition Plan: Anticipate he will be able to go back home in the next 24 to 48 hours  -initial plan for transition to CIR versus SNF but family and patient are declining requesting patient home, awaiting clearance from orthopedic surgery to transition home pending wound care and possible wound VAC.  Status is: Inpatient Remains inpatient appropriate because: Ongoing surgical evaluation  Medications: Scheduled:  amLODipine  10 mg Oral Daily   amoxicillin-clavulanate  1 tablet Oral Q12H   vitamin C  1,000 mg Oral Daily   aspirin EC  81 mg Oral Daily   clopidogrel  75 mg Oral Daily   collagenase   Topical Daily   docusate sodium  100 mg Oral Daily   enoxaparin (LOVENOX) injection  40 mg Subcutaneous Daily   leptospermum manuka honey  1 Application Topical Daily   lisinopril  10 mg Oral BID   metoprolol tartrate  50 mg Oral BID   nutrition supplement (JUVEN)  1 packet Oral BID BM   pantoprazole  40 mg Oral Daily   rosuvastatin  10 mg Oral Once per day on Mon Thu   zinc sulfate  220 mg Oral Daily   Continuous:  magnesium sulfate bolus IVPB     WUX:[id number], alum & mag hydroxide-simeth, bisacodyl, guaiFENesin-dextromethorphan, hydrALAZINE, hydrALAZINE, HYDROmorphone (DILAUDID) injection, labetalol, magnesium citrate, magnesium sulfate bolus IVPB, metoprolol tartrate, ondansetron, oxyCODONE, oxyCODONE, oxyCODONE-acetaminophen, phenol, polyethylene glycol, potassium chloride  Antibiotics: Anti-infectives (From admission, onward)    Start     Dose/Rate Route Frequency Ordered Stop   06/28/22 1045  amoxicillin-clavulanate (AUGMENTIN) 875-125 MG per tablet 1 tablet        1 tablet Oral Every 12 hours 06/28/22 0952     06/27/22 1630  ceFAZolin (ANCEF) IVPB 2g/100 mL premix  Status:  Discontinued        2 g 200 mL/hr over 30 Minutes Intravenous On call to O.R. 06/27/22 1524 06/27/22 1823   06/27/22 1526  ceFAZolin (ANCEF) 2-4 GM/100ML-% IVPB       Note to Pharmacy: [facility], [place]: cabinet override      06/27/22 1526 06/28/22 0344   06/26/22 0000  ceFEPIme (MAXIPIME) 2 g  in sodium chloride 0.9 % 100 mL IVPB  Status:  Discontinued        2 g 200 mL/hr over 30 Minutes Intravenous Every 8 hours 06/25/22 2354 06/28/22 0952   06/23/22 1200  vancomycin (VANCOREADY) IVPB 750 mg/150 mL  Status:  Discontinued        750 mg 150 mL/hr over 60 Minutes Intravenous Every 12 hours 06/23/22 0528 06/28/22 0952   06/23/22 0615  ceFEPIme (MAXIPIME) 2 g in sodium chloride 0.9 % 100 mL IVPB  Status:  Discontinued        2 g 200 mL/hr over 30 Minutes Intravenous Every 8 hours 06/23/22 0526 06/25/22 2354   06/22/22 2145  vancomycin (VANCOREADY) IVPB 2000 mg/400 mL  Status:  Discontinued        2,000 mg 200 mL/hr over 120 Minutes Intravenous  Once 06/22/22 2134 06/22/22 2138   06/22/22 2145  cefTRIAXone (ROCEPHIN) 2 g in sodium chloride 0.9 % 100 mL IVPB        2 g 200 mL/hr over 30 Minutes Intravenous  Once 06/22/22 2134 06/22/22 2330   06/22/22 2145  vancomycin (VANCOCIN) IVPB 1000 mg/200 mL premix       See Hyperspace for full Linked  Orders Report.   1,000 mg 200 mL/hr over 60 Minutes Intravenous  Once 06/22/22 2137 06/23/22 0057   06/22/22 2145  vancomycin (VANCOCIN) IVPB 1000 mg/200 mL premix       See Hyperspace for full Linked Orders Report.   1,000 mg 200 mL/hr over 60 Minutes Intravenous  Once 06/22/22 2137 06/23/22 0057       Objective:  Vital Signs  Vitals:   06/29/22 0850 06/29/22 1002 06/29/22 1937 06/30/22 0457  BP: (!) 149/79 (!) 149/79 (!) 147/84 137/83  Pulse: 100 100 90 84  Resp: 19 19 10 14   Temp: 99.1 F (37.3 C)  98 F (36.7 C) 98.7 F (37.1 C)  TempSrc:   Oral Oral  SpO2:   100% 100%  Weight:      Height:        Intake/Output Summary (Last 24 hours) at 06/30/2022 0803 Last data filed at 06/30/2022 0457 Gross per 24 hour  Intake --  Output 1250 ml  Net -1250 ml    Filed Weights   06/23/22 0145  Weight: 83.1 kg    General appearance: Awake alert.  In no distress Resp: Clear to auscultation bilaterally.  Normal effort Cardio:  S1-S2 is normal regular.  No S3-S4.  No rubs murmurs or bruit GI: Abdomen is soft.  Nontender nondistended.  Bowel sounds are present normal.  No masses organomegaly Extremities: Right lower extremity stump with wound VAC intact   Lab Results:  Data Reviewed: I have personally reviewed following labs and reports of the imaging studies  CBC: Recent Labs  Lab 06/24/22 0034 06/26/22 0123 06/28/22 0129 06/29/22 0057 06/30/22 0102  WBC 7.6 8.4 9.3 8.9 9.3  HGB 8.5* 8.3* 8.3* 7.5* 7.6*  HCT 27.4* 26.2* 27.1* 23.4* 24.6*  MCV 72.9* 72.8* 73.2* 72.4* 73.2*  PLT 294 309 306 279 278     Basic Metabolic Panel: Recent Labs  Lab 06/24/22 0034 06/26/22 0123 06/28/22 0129 06/29/22 0057 06/30/22 0102  NA 136 137 136 136 137  K 3.7 3.4* 4.2 3.2* 3.1*  CL 103 106 103 105 103  CO2 26 25 26 23 26   GLUCOSE 83 75 144* 97 104*  BUN 10 10 10 19 17   CREATININE 0.79 0.79 0.98 0.90 0.70  CALCIUM 8.7* 8.9 9.1 8.9 9.1  MG 1.8  --   --   --   --      GFR: Estimated Creatinine Clearance: 105.3 mL/min (by C-G formula based on SCr of 0.7 mg/dL).  Liver Function Tests: No results for input(s): "AST", "ALT", "ALKPHOS", "BILITOT", "PROT", "ALBUMIN" in the last 168 hours.    Coagulation Profile: No results for input(s): "INR", "PROTIME" in the last 168 hours.     CBG: Recent Labs  Lab 06/29/22 0654 06/29/22 0747 06/29/22 1152 06/29/22 1616 06/30/22 0654  GLUCAP 83 88 105* 121* 83      Recent Results (from the past 240 hour(s))  Culture, blood (Routine x 2)     Status: None   Collection Time: 06/22/22  8:50 PM   Specimen: BLOOD RIGHT FOREARM  Result Value Ref Range Status   Specimen Description   Final    BLOOD RIGHT FOREARM BLOOD Performed at [facility], [address] [place], [place] [address]    Special Requests   Final    Blood Culture adequate volume BOTTLES DRAWN AEROBIC AND ANAEROBIC Performed at [facility], [address] [place], [place] [address]    Culture  Final    NO GROWTH 5 DAYS Performed at [facility], [place] [address] [place], [place] [address]    Report Status 06/27/2022 FINAL  Final  Culture, blood (Routine x 2)     Status: None   Collection Time: 06/22/22  8:59 PM   Specimen: Right Antecubital; Blood  Result Value Ref Range Status   Specimen Description   Final    RIGHT ANTECUBITAL BLOOD Performed at [facility], [address] [place], [place] [address]    Special Requests   Final    Blood Culture adequate volume BOTTLES DRAWN AEROBIC AND ANAEROBIC Performed at [facility], [address] [place], [place] [address]    Culture   Final    NO GROWTH 5 DAYS Performed at [facility], [place] [address] [place], [place] [address]    Report Status 06/27/2022 FINAL  Final  Surgical pcr screen     Status: None   Collection Time: 06/26/22  6:03 PM   Specimen: Nasal Mucosa; Nasal Swab  Result Value Ref Range Status   MRSA, PCR NEGATIVE NEGATIVE Final   Staphylococcus aureus NEGATIVE NEGATIVE Final    Comment: (NOTE) The Xpert SA Assay (FDA approved for NASAL specimens in patients 31 years of age and older), is one component of a comprehensive surveillance program. It is not intended to diagnose infection nor to guide or monitor treatment. Performed at [facility], [place] [address], [place],  [address]     Radiology Studies: No results found.   LOS: 7 days   [facility] Pager on [email or web address]  06/30/2022, 8:03 AM

## 2022-06-30 NOTE — TOC Progression Note (Signed)
Transition of Care [facility]) - Progression Note    Patient Details  Name: Jeffrey Fields MRN: [id number] Date of Birth: [birth year]  [facility]) CM/SW Contact  Bartholomew Crews, RN Phone Number: [phone] 06/30/2022, 12:48 PM  Clinical Narrative:     Spoke with patient at the bedside to discuss post acute transition. Confirmed previous RNCM encounter that no HH needed. Patient does not want [facility] stating that he is already doing PT on his own. Advised that if he changes his mind about needing PT to contact his PCP and a referral can be placed as an outpatient. Patient verbalized understanding. Confirmed home address - patient usually uses wheelchair transportation, but does not have wheelchair at hospital. Patient is not able to get in/out of car at this time. Patient is agreeable to non-emergent wheelchair transportation.   Expected Discharge Plan: Home/Self Care (pt declined home health services) Barriers to Discharge: Continued Medical Work up  Expected Discharge Plan and Services Expected Discharge Plan: Home/Self Care (pt declined home health services)       Living arrangements for the past 2 months: Single Family Home                                       Social Determinants of Health (SDOH) Interventions    Readmission Risk Interventions     No data to display

## 2022-07-01 DIAGNOSIS — M86271 Subacute osteomyelitis, right ankle and foot: Secondary | ICD-10-CM | POA: Diagnosis not present

## 2022-07-01 LAB — CBC
HCT: 26.2 % — ABNORMAL LOW (ref 39.0–52.0)
Hemoglobin: 7.9 g/dL — ABNORMAL LOW (ref 13.0–17.0)
MCH: 22.5 pg — ABNORMAL LOW (ref 26.0–34.0)
MCHC: 30.2 g/dL (ref 30.0–36.0)
MCV: 74.6 fL — ABNORMAL LOW (ref 80.0–100.0)
Platelets: 344 10*3/uL (ref 150–400)
RBC: 3.51 MIL/uL — ABNORMAL LOW (ref 4.22–5.81)
RDW: 17.2 % — ABNORMAL HIGH (ref 11.5–15.5)
WBC: 9.4 10*3/uL (ref 4.0–10.5)
nRBC: 0 % (ref 0.0–0.2)

## 2022-07-01 LAB — BASIC METABOLIC PANEL
Anion gap: 9 (ref 5–15)
BUN: 15 mg/dL (ref 8–23)
CO2: 26 mmol/L (ref 22–32)
Calcium: 9.3 mg/dL (ref 8.9–10.3)
Chloride: 103 mmol/L (ref 98–111)
Creatinine, Ser: 0.88 mg/dL (ref 0.61–1.24)
GFR, Estimated: >60 mL/min (ref >60)
Glucose, Bld: 112 mg/dL — ABNORMAL HIGH (ref 70–99)
Potassium: 3.5 mmol/L (ref 3.5–5.1)
Sodium: 138 mmol/L (ref 135–145)

## 2022-07-01 LAB — GLUCOSE, CAPILLARY
Glucose-Capillary: 88 mg/dL (ref 70–99)
Glucose-Capillary: 105 mg/dL — ABNORMAL HIGH (ref 70–99)
Glucose-Capillary: 94 mg/dL (ref 70–99)

## 2022-07-01 MED ORDER — COLLAGENASE 250 UNIT/GM EX OINT: TOPICAL_OINTMENT | Freq: Every day | CUTANEOUS | 0 refills | Status: DC

## 2022-07-01 MED ORDER — AMOXICILLIN-POT CLAVULANATE 875-125 MG PO TABS: 1.0000 | ORAL_TABLET | Freq: Two times a day (BID) | ORAL | 0 refills | Status: AC

## 2022-07-01 MED ORDER — MEDIHONEY WOUND/BURN DRESSING EX PSTE: 1.0000 | PASTE | Freq: Every day | CUTANEOUS | 0 refills | Status: AC

## 2022-07-01 MED ORDER — PANTOPRAZOLE SODIUM 40 MG PO TBEC: 40.0000 mg | DELAYED_RELEASE_TABLET | Freq: Every day | ORAL | 2 refills | Status: AC

## 2022-07-01 MED ORDER — CLOPIDOGREL BISULFATE 75 MG PO TABS: 75.0000 mg | ORAL_TABLET | Freq: Every day | ORAL | 2 refills | Status: DC

## 2022-07-01 MED ORDER — DOCUSATE SODIUM 100 MG PO CAPS: 100.0000 mg | ORAL_CAPSULE | Freq: Every day | ORAL | 0 refills | Status: DC

## 2022-07-01 MED ORDER — POLYETHYLENE GLYCOL 3350 17 G PO PACK: 17.0000 g | PACK | Freq: Every day | ORAL | 0 refills | Status: DC | PRN

## 2022-07-01 MED ORDER — ACETAMINOPHEN 325 MG PO TABS: 325.0000 mg | ORAL_TABLET | Freq: Four times a day (QID) | ORAL | 0 refills | Status: DC | PRN

## 2022-07-01 MED ORDER — ASPIRIN 81 MG PO TBEC: 81.0000 mg | DELAYED_RELEASE_TABLET | Freq: Every day | ORAL | 12 refills | Status: DC

## 2022-07-01 NOTE — Discharge Summary (Signed)
Physician Discharge Summary  Jeffrey Fields [id number] DOB: January 04, 1956 DOA: 06/22/2022  PCP: [name], DO  Admit date: 06/22/2022 Discharge date: 07/01/2022  Admitted From: Home Disposition: Home  Recommendations for Outpatient Follow-up:  Follow up with PCP in 1-2 weeks Follow-up with orthopedic surgery as scheduled  Home Health: Offered but refused Equipment/Devices: None  Discharge Condition: Stable CODE STATUS: Full Diet recommendation: Low-carb low-salt low-fat diet  Brief/Interim Summary: 66 y.o. male with medical history significant of type 2 diabetes, hypertension, hyperlipidemia, paraplegia secondary to spinal stenosis presented to the ED with complaint of bilateral lower extremity/foot wounds.  Imaging studies raised concern for osteomyelitis involving bilateral feet.  Patient was hospitalized.  Orthopedics was consulted.     Patient tolerated right AKA 06/27/2022, postop wound VAC and dressing changes per orthopedic surgery, otherwise pain currently well controlled initially evaluated by PT recommending disposition to SNF for ongoing physical therapy and rehab which patient declined.  Subsequently discussed home health physical therapy which again patient declined.  At this time he is otherwise medically stable, defer to orthopedic surgery for ongoing wound care and analgesics.  We will continue Augmentin until outpatient follow-up given noted infection as below.   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  Soft tissue gas was also noted. Similar findings in right fifth proximal phalanx and the mid to distal fifth metatarsal. Patient was started on broad-spectrum antibiotics -transition to Augmentin postoperatively, continue as above until follow-up with orthopedic surgery Ortho following -wound VAC versus dressing changes per orthopedic recommendations   Coronary artery disease February Non-STEMI noted At outside facility in [place] PCI of LAD and left main and with 3 stents to LAD .   Placed on dual antiplatelets with aspirin and Plavix at that time- no follow up since that time per previous discussion -unable to secure refills for his medication due to poor follow-up (only on aspirin) Resume DAPT - recommend outpt cardiology follow up as previously recommended   Ischemic cardiomyopathy/chronic systolic CHF EF previously 25 to 30%  Currently on metoprolol lisinopril.  His Wilder Glade is on hold. Not on diuretics outpatient Echo here shows EF has improved to 35 to 40%.  Global hypokinesis noted.  Diastolic dysfunction also seen. He is supposed to be followed by Dr. Horton Finer.    Paroxysmal atrial fibrillation Was on amiodarone at the time of discharge from [facility] in February.  Does not seem to be on it anymore.  EKG shows sinus rhythm.  He is on metoprolol. It does not appear the patient was ever started on anticoagulation.  EKG from the current admission shows sinus rhythm. Will not reinitiate amiodarone at this time since compliance is poor.  We will recommend that he follow-up with his cardiologist in [facility] and discuss these issues.   Diabetes mellitus type 2, controlled HbA1c 4.9.  Holding oral agents.  SSI was discontinued.  CBGs have improved.  He is tolerating his diet.  Stop IV fluids.   Essential hypertension Currently  noted to be on amlodipine lisinopril and metoprolol.  Blood  pressure reasonably well controlled.  Will not be too aggressive with blood pressure control at this time.   Hyperlipidemia Noted to be on statin.   Microcytic anemia, multifactorial - Likely chronic with iron deficiency anemia, chronic disease and malnutrition -Repeat labs outpatient, stable here postoperatively   Paraplegia, chronic This is chronic secondary to spinal stenosis. 20+ per report with worsening generalized weakness.   Hypokalemia Repeat labs with PCP, improved diet as discussed   Discharge Instructions  Discharge Instructions     Change dressing   Complete by: As directed    At time of discharge please remove the wound VAC dressing and apply a dry dressing.  This may be changed as needed after discharge.      Allergies as of 07/01/2022   No Known Allergies      Medication List     TAKE these medications    acetaminophen 325 MG tablet Commonly known as: TYLENOL Take 1-2 tablets (325-650 mg total) by mouth every 6 (six) hours as needed for mild pain (pain score 1-3 or temp > 100.5).   amLODipine 10 MG tablet Commonly known as: NORVASC Take 10 mg by mouth daily.   amoxicillin-clavulanate 875-125 MG tablet Commonly known as: AUGMENTIN Take 1 tablet by mouth every 12 (twelve) hours for 10 days.   aspirin EC 81 MG tablet Take 1 tablet (81 mg total) by mouth daily. Swallow whole.   clopidogrel 75 MG tablet Commonly known as: PLAVIX Take 1 tablet (75 mg total) by mouth daily.   collagenase 250 UNIT/GM ointment Commonly known as: SANTYL Apply topically daily.   docusate sodium 100 MG capsule Commonly known as: COLACE Take 1 capsule (100 mg total) by mouth daily.   Farxiga 10 MG Tabs tablet Generic drug: dapagliflozin propanediol Take 10 mg by mouth daily.   furosemide 20 MG tablet Commonly known as: LASIX Take 20 mg by mouth daily.   lactose free nutrition Liqd Take 237 mLs by mouth in the morning and at bedtime.   leptospermum manuka  honey Pste paste Apply 1 Application topically daily.   lisinopril 10 MG tablet Commonly known as: ZESTRIL Take 10 mg by mouth 2 (two) times daily.   metoprolol tartrate 50 MG tablet Commonly known as: LOPRESSOR Take 50 mg by mouth 2 (two) times daily.   nitroGLYCERIN 0.4 MG SL tablet Commonly known as: NITROSTAT Place 0.4 mg under the tongue every 5 (five) minutes as needed for chest pain.   oxyCODONE-acetaminophen 7.5-325 MG tablet Commonly known as: PERCOCET Take 1 tablet by mouth every 6 (six) hours as needed for moderate pain.   pantoprazole 40 MG tablet Commonly known as: PROTONIX Take 1 tablet (40 mg total) by mouth daily.   polyethylene glycol 17 g packet Commonly known as: MIRALAX / GLYCOLAX Take 17 g by mouth daily as needed for mild constipation.   potassium chloride SA 20 MEQ tablet Commonly known as: KLOR-CON M Take 20 mEq by mouth daily.   rosuvastatin 10 MG tablet Commonly known as: CRESTOR Take 10 mg by mouth 2 (two) times a week.   SSD 1 % cream Generic drug: silver sulfADIAZINE Apply 1 Application topically daily.               Discharge Care Instructions  (From admission, onward)           Start     Ordered   06/28/22 0000  Change dressing  Comments: At time of discharge please remove the wound VAC dressing and apply a dry dressing.  This may be changed as needed after discharge.   06/28/22 1156            Follow-up Information     [name], DO Follow up.   Specialty: Internal Medicine Contact information: [place] [address] [id number]         Newt Minion, MD Follow up in 1 week(s).   Specialty: Orthopedic Surgery Contact information: [place] [address] [phone]         Horton Finer, MD. Schedule an appointment as soon as possible for a visit in 1 week(s).   Specialty: Cardiology Contact information: [place] [address] [phone]                No Known Allergies  Consultations: Orthopedic surgery  Procedures/Studies: ECHOCARDIOGRAM COMPLETE  Result Date: 06/27/2022    ECHOCARDIOGRAM REPORT   Patient Name:   Lance M Fields Date of Exam: 06/26/2022 Medical Rec #:  [id number]        Height:       75.5 in Accession #:    [id number]       Weight:       183.2 lb Date of Birth:  [birth year]        BSA:          2.125 m Patient Age:    84 years         BP:           139/86 mmHg Patient Gender: M                HR:           88 bpm. Exam Location:  Inpatient Procedure: 2D Echo Indications:    pre-operative evaluation  History:        Patient has no prior history of Echocardiogram examinations.                 Signs/Symptoms:Edema; Risk Factors:Diabetes, Hypertension and                 Dyslipidemia.  Sonographer:    Johny Chess RDCS Referring Phys: [phone] [facility]  Sonographer Comments: Suboptimal subcostal window. IMPRESSIONS  1. Left ventricular ejection fraction, by estimation, is 35 to 40%. The left ventricle has moderately decreased function. The left ventricle demonstrates global hypokinesis. Left ventricular diastolic parameters are consistent with Grade I diastolic dysfunction (impaired relaxation).  2. Right ventricular systolic function is normal. The right ventricular size is normal.  3. Left atrial size was mildly dilated.  4. The mitral valve is normal in structure. Mild mitral valve regurgitation. No evidence of mitral stenosis.  5. The aortic valve was not well visualized. Aortic valve regurgitation is not visualized. No aortic stenosis is present.  6. The inferior vena cava is normal in size with greater than 50% respiratory variability, suggesting right atrial pressure of 3 mmHg. FINDINGS  Left Ventricle: Left ventricular ejection fraction, by estimation, is 35 to 40%. The left ventricle has moderately decreased function. The left ventricle demonstrates global hypokinesis. The  left ventricular internal cavity size was normal in size. There is no left ventricular hypertrophy. Left ventricular diastolic parameters are consistent with Grade I diastolic dysfunction (impaired relaxation). Right Ventricle: The right ventricular size is normal. No increase in right ventricular wall thickness. Right ventricular systolic function is  normal. Left Atrium: Left atrial size was mildly dilated. Right Atrium: Right atrial size was normal in size. Pericardium: There is no evidence of pericardial effusion. Mitral Valve: The mitral valve is normal in structure. Mild mitral valve regurgitation. No evidence of mitral valve stenosis. Tricuspid Valve: The tricuspid valve is normal in structure. Tricuspid valve regurgitation is not demonstrated. No evidence of tricuspid stenosis. Aortic Valve: The aortic valve was not well visualized. Aortic valve regurgitation is not visualized. No aortic stenosis is present. Pulmonic Valve: The pulmonic valve was normal in structure. Pulmonic valve regurgitation is not visualized. No evidence of pulmonic stenosis. Aorta: The aortic root is normal in size and structure. Venous: The inferior vena cava is normal in size with greater than 50% respiratory variability, suggesting right atrial pressure of 3 mmHg. IAS/Shunts: No atrial level shunt detected by color flow Doppler.  LEFT VENTRICLE PLAX 2D LVIDd:         5.10 cm      Diastology LVIDs:         3.90 cm      LV e' medial:  5.22 cm/s LV PW:         1.00 cm      LV e' lateral: 6.53 cm/s LV IVS:        1.10 cm LVOT diam:     2.10 cm LV SV:         61 LV SV Index:   29 LVOT Area:     3.46 cm  LV Volumes (MOD) LV vol d, MOD A4C: 140.0 ml LV vol s, MOD A4C: 82.6 ml LV SV MOD A4C:     140.0 ml RIGHT VENTRICLE RV S prime:     17.00 cm/s TAPSE (M-mode): 2.7 cm LEFT ATRIUM             Index        RIGHT ATRIUM           Index LA diam:        3.60 cm 1.69 cm/m   RA Area:     17.50 cm LA Vol (A2C):   72.9 ml 34.31 ml/m  RA Volume:    45.40 ml  21.37 ml/m LA Vol (A4C):   67.2 ml 31.63 ml/m LA Biplane Vol: 72.1 ml 33.94 ml/m  AORTIC VALVE LVOT Vmax:   104.00 cm/s LVOT Vmean:  70.000 cm/s LVOT VTI:    0.177 m  AORTA Ao Root diam: 3.50 cm  SHUNTS Systemic VTI:  0.18 m Systemic Diam: 2.10 cm Candee Furbish MD Electronically signed by Candee Furbish MD Signature Date/Time: 06/27/2022/6:29:11 AM    Final    VAS [place] ABI WITH/WO TBI  Result Date: 06/25/2022  LOWER EXTREMITY DOPPLER STUDY Patient Name:  Wisdom M Fields  Date of Exam:   06/24/2022 Medical Rec #: [id number]         Accession #:    [id number] Date of Birth: 02-Oct-1956         Patient Gender: M Patient Age:   21 years Exam Location:  [facility] Procedure:      VAS [place] ABI WITH/WO TBI Referring Phys: Wandra Feinstein RATHORE --------------------------------------------------------------------------------  Indications: Ulceration, gangrene, and peripheral artery disease. High Risk Factors: Hypertension, hyperlipidemia, Diabetes. Other Factors: Osteomyelitis.  Comparison Study: No prior studies. Performing Technologist: Darlin Coco RDMS RVT  Examination Guidelines: A complete evaluation includes at minimum, Doppler waveform signals and systolic blood pressure reading at the level of bilateral brachial, anterior tibial, and posterior tibial arteries, when  vessel segments are accessible. Bilateral testing is considered an integral part of a complete examination. Photoelectric Plethysmograph (PPG) waveforms and toe systolic pressure readings are included as required and additional duplex testing as needed. Limited examinations for reoccurring indications may be performed as noted.  ABI Findings: +---------+------------------+-----+-------------------+-----------------------+ Right    Rt Pressure (mmHg)IndexWaveform           Comment                 +---------+------------------+-----+-------------------+-----------------------+ Brachial 153                    triphasic                                   +---------+------------------+-----+-------------------+-----------------------+ PTA      101               0.66 dampened monophasicLikely falsely elevated                                                    in setting of arterial                                                     calcification           +---------+------------------+-----+-------------------+-----------------------+ DP       104               0.68 dampened monophasicLikely falsely elevated                                                    in setting of arterial                                                     calcification           +---------+------------------+-----+-------------------+-----------------------+ Great Toe0                 0.00 Absent                                     +---------+------------------+-----+-------------------+-----------------------+ +---------+------------------+-----+-------------------+-----------------------+ Left     Lt Pressure (mmHg)IndexWaveform           Comment                 +---------+------------------+-----+-------------------+-----------------------+ Brachial 150                    triphasic                                  +---------+------------------+-----+-------------------+-----------------------+ PTA      0  0.00 absent                                     +---------+------------------+-----+-------------------+-----------------------+ DP       146               0.95 dampened monophasicLikely falsely elevated                                                    in setting of arterial                                                     calcification           +---------+------------------+-----+-------------------+-----------------------+ Great Toe75                0.49 Abnormal                                    +---------+------------------+-----+-------------------+-----------------------+  Arterial wall calcification precludes accurate ankle pressures and ABIs.  Summary: Right: Resting right ankle-brachial index indicates moderate right lower extremity arterial disease. However, dampened monophasic Doppler waveforms suggest falsely elevated pressures. The right toe waveform is absent. Left: Although ankle brachial indices are within normal limits (0.95-1.29), arterial Doppler waveforms at the ankle suggest some component of arterial occlusive disease. The left toe-brachial index is abnormal.  *See table(s) above for measurements and observations.  Electronically signed by Monica Martinez MD on 06/25/2022 at 2:57:18 PM.    Final    DG Abd 1 View  Result Date: 06/23/2022 CLINICAL DATA:  Subdiaphragmatic lucency seen on chest x-ray. EXAM: ABDOMEN - 1 VIEW COMPARISON:  Chest x-ray from yesterday. FINDINGS: Mild diffusely distended colon containing air and stool accounts for the lucency seen on chest x-ray. No definite pneumoperitoneum. No obstruction. No radio-opaque calculi or other significant radiographic abnormality are seen. No acute osseous abnormality. IMPRESSION: 1. No acute findings. Prominent colon distended with air and stool accounts for the findings on chest x-ray. Correlate for constipation. Electronically Signed   By: Titus Dubin M.D.   On: 06/23/2022 09:46   DG Tibia/Fibula Right  Result Date: 06/22/2022 CLINICAL DATA:  Foot wound EXAM: RIGHT TIBIA AND FIBULA - 2 VIEW COMPARISON:  None Available. FINDINGS: No fracture or malalignment. Soft tissue ulcer or wound at the distal lower leg on the lateral side. No definite underlying periostitis or osseous destructive changes. Generalized soft tissue edema. Advanced arthritis at the knee. IMPRESSION: No definite acute osseous abnormality. Wound or ulcer distal lateral lower leg Electronically Signed   By: Donavan Foil M.D.   On: 06/22/2022 21:33    DG Foot 2 Views Right  Result Date: 06/22/2022 CLINICAL DATA:  Foot wound EXAM: RIGHT FOOT - 2 VIEW COMPARISON:  None Available. FINDINGS: Bones appear demineralized. Large plantar calcaneal spur. Tarsal degenerative change. Moderate arthritis at the first IP and MTP joints. Partial osseous bridging across the second MTP joint. Ulcer lateral aspect of the foot. Erosion at the base of the fifth proximal phalanx. Erosion and osseous destructive change involving  the distal shaft and head of the fifth metacarpal. IMPRESSION: 1. Positive for osteomyelitis involving the base of the fifth proximal phalanx and the mid to distal fifth metatarsal with overlying wound Electronically Signed   By: Donavan Foil M.D.   On: 06/22/2022 21:25   DG Foot Complete Left  Result Date: 06/22/2022 CLINICAL DATA:  Foot wounds EXAM: LEFT FOOT - COMPLETE 3+ VIEW COMPARISON:  None Available. FINDINGS: Bones appear demineralized. Degenerative changes at the TMT joints. Ulcer lateral aspect of the foot at the level of fifth MTP joint. Erosive change at the base of the fifth proximal phalanx and head of the fifth metatarsal, consistent with osteomyelitis. Possible pathologic fracture deformity at the base of the fifth proximal phalanx. Moderate degenerative change at the first IP and MTP joints. Small gas in the soft tissues could relate to ulcer or necrotizing infection. IMPRESSION: 1. Soft tissue ulcer lateral aspect of the foot at the level of the fifth MTP joint with evidence for osteomyelitis involving the base of the fifth proximal phalanx and the head of the fifth metatarsal. Small amount of soft tissue gas could relate to the patient's wound or be due to necrotic infection Electronically Signed   By: Donavan Foil M.D.   On: 06/22/2022 21:23   DG Chest 2 View  Result Date: 06/22/2022 CLINICAL DATA:  Wounds possible sepsis EXAM: CHEST - 2 VIEW COMPARISON:  Chest x-ray 12/01/2021, chest CT 11/26/2021, abdomen radiograph  12/01/2021 FINDINGS: No acute airspace disease or effusion. Stable cardiomediastinal silhouette, right hilar convex opacity felt related to ectatic ascending aorta on chest CT. No pneumothorax. Probable prominent air distended bowel loops in the upper abdomen beneath the diaphragms IMPRESSION: 1. No radiographic evidence for acute cardiopulmonary abnormality 2. Lucency beneath the diaphragms probably due to marked air distension of bowel given appearance on lateral view but recommend dedicated abdominal radiograph and/or CT for further assessment. Electronically Signed   By: Donavan Foil M.D.   On: 06/22/2022 19:53     Subjective: No acute issues or events overnight   Discharge Exam: Vitals:   06/30/22 2039 07/01/22 0448  BP: 114/76 (!) 141/82  Pulse:  84  Resp: 18 16  Temp: 98.7 F (37.1 C) 98.3 F (36.8 C)  SpO2: 98% 100%   Vitals:   06/30/22 0457 06/30/22 0922 06/30/22 2039 07/01/22 0448  BP: 137/83 (!) 156/89 114/76 (!) 141/82  Pulse: 84 94  84  Resp: 14 18 18 16   Temp: 98.7 F (37.1 C) 98.1 F (36.7 C) 98.7 F (37.1 C) 98.3 F (36.8 C)  TempSrc: Oral  Oral   SpO2: 100% 100% 98% 100%  Weight:      Height:        General appearance: Awake alert.  In no distress Resp: Clear to auscultation bilaterally.  Normal effort Cardio: S1-S2 is normal regular.  No S3-S4.  No rubs murmurs or bruit GI: Abdomen is soft.  Nontender nondistended.  Bowel sounds are present normal.  No masses organomegaly Extremities: Right lower extremity stump with wound VAC intact     The results of significant diagnostics from this hospitalization (including imaging, microbiology, ancillary and laboratory) are listed below for reference.     Microbiology: Recent Results (from the past 240 hour(s))  Culture, blood (Routine x 2)     Status: None   Collection Time: 06/22/22  8:50 PM   Specimen: BLOOD RIGHT FOREARM  Result Value Ref Range Status   Specimen Description   Final  BLOOD RIGHT  FOREARM BLOOD Performed at [facility], [address] [place], [place] [address]    Special Requests   Final    Blood Culture adequate volume BOTTLES DRAWN AEROBIC AND ANAEROBIC Performed at [facility], [address] [place], [place] [address]    Culture   Final    NO GROWTH 5 DAYS Performed at [facility], [place] [address], [place], [place] [address]    Report Status 06/27/2022 FINAL  Final  Culture, blood (Routine x 2)     Status: None   Collection Time: 06/22/22  8:59 PM   Specimen: Right Antecubital; Blood  Result Value Ref Range Status   Specimen Description   Final    RIGHT ANTECUBITAL BLOOD Performed at [facility], [address] [place], [place] [address]    Special Requests   Final    Blood Culture adequate volume BOTTLES DRAWN AEROBIC AND ANAEROBIC Performed at [facility], [address], [place], [place] [address]    Culture   Final    NO GROWTH 5 DAYS Performed at [facility], [place] [address] [place], [place] [address]    Report Status 06/27/2022 FINAL  Final  Surgical pcr screen     Status: None   Collection Time: 06/26/22  6:03 PM   Specimen: Nasal Mucosa; Nasal Swab  Result Value Ref Range Status   MRSA, PCR NEGATIVE NEGATIVE Final   Staphylococcus aureus NEGATIVE NEGATIVE Final    Comment: (NOTE) The Xpert SA Assay (FDA approved for NASAL specimens in patients 68 years of age and older), is one component of a comprehensive surveillance program. It is not intended to diagnose infection nor to guide or monitor treatment. Performed at [facility], [place] [address], [place], [place] [address]      Labs: BNP (last 3 results) No results for input(s): "BNP" in the last 8760 hours. Basic Metabolic Panel: Recent Labs  Lab 06/26/22 0123 06/28/22 0129 06/29/22 0057 06/30/22 0102 07/01/22 0148  NA 137 136 136 137 138  K 3.4* 4.2 3.2* 3.1* 3.5  CL 106 103 105 103 103  CO2 25 26 23 26  26   GLUCOSE 75 144* 97 104* 112*  BUN 10 10 19 17 15   CREATININE 0.79 0.98 0.90 0.70 0.88  CALCIUM 8.9 9.1 8.9 9.1 9.3   Liver Function Tests: No results for input(s): "AST", "ALT", "ALKPHOS", "BILITOT", "PROT", "ALBUMIN" in the last 168 hours. No results for input(s): "LIPASE", "AMYLASE" in the last 168 hours. No results for input(s): "AMMONIA" in the last 168 hours. CBC: Recent Labs  Lab 06/26/22 0123 06/28/22 0129 06/29/22 0057 06/30/22 0102 07/01/22 0148  WBC 8.4 9.3 8.9 9.3 9.4  HGB 8.3* 8.3* 7.5* 7.6* 7.9*  HCT 26.2* 27.1* 23.4* 24.6* 26.2*  MCV 72.8* 73.2* 72.4* 73.2* 74.6*  PLT 309 306 279 278 344   Cardiac Enzymes: No results for input(s): "CKTOTAL", "CKMB", "CKMBINDEX", "TROPONINI" in the last 168 hours. BNP: Invalid input(s): "POCBNP" CBG: Recent Labs  Lab 06/30/22 0654 06/30/22 0925 06/30/22 1202 06/30/22 2348 07/01/22 0632  GLUCAP 83 84 96 93 88   D-Dimer No results for input(s): "DDIMER" in the last 72 hours. Hgb A1c No results for input(s): "HGBA1C" in the last 72 hours. Lipid Profile No results for input(s): "CHOL", "HDL", "LDLCALC", "TRIG", "CHOLHDL", "LDLDIRECT" in the last 72 hours. Thyroid function studies No results for input(s): "TSH", "T4TOTAL", "T3FREE", "THYROIDAB"  in the last 72 hours.  Invalid input(s): "FREET3" Anemia work up No results for input(s): "VITAMINB12", "FOLATE", "FERRITIN", "TIBC", "IRON", "RETICCTPCT" in the last 72 hours. Urinalysis    Component Value Date/Time   COLORURINE YELLOW 11/16/2016 1409   APPEARANCEUR TURBID (A) 11/16/2016 1409   LABSPEC 1.012 11/16/2016 1409   PHURINE 7.5 11/16/2016 1409   GLUCOSEU NEGATIVE 11/16/2016 1409   HGBUR LARGE (A) 11/16/2016 1409   BILIRUBINUR NEGATIVE 11/16/2016 1409   KETONESUR NEGATIVE 11/16/2016 1409   PROTEINUR 30 (A) 11/16/2016 1409   NITRITE NEGATIVE 11/16/2016 1409   LEUKOCYTESUR LARGE (A) 11/16/2016 1409   Sepsis Labs Recent Labs  Lab 06/28/22 0129  06/29/22 0057 06/30/22 0102 07/01/22 0148  WBC 9.3 8.9 9.3 9.4   Microbiology Recent Results (from the past 240 hour(s))  Culture, blood (Routine x 2)     Status: None   Collection Time: 06/22/22  8:50 PM   Specimen: BLOOD RIGHT FOREARM  Result Value Ref Range Status   Specimen Description   Final    BLOOD RIGHT FOREARM BLOOD Performed at [facility], [address] [place], [place] [address]    Special Requests   Final    Blood Culture adequate volume BOTTLES DRAWN AEROBIC AND ANAEROBIC Performed at [facility], [address] [place], [place] [address]    Culture   Final    NO GROWTH 5 DAYS Performed at [facility], [place] [address], [place], [place] [address]    Report Status 06/27/2022 FINAL  Final  Culture, blood (Routine x 2)     Status: None   Collection Time: 06/22/22  8:59 PM   Specimen: Right Antecubital; Blood  Result Value Ref Range Status   Specimen Description   Final    RIGHT ANTECUBITAL BLOOD Performed at [facility], [address] [place], [place] [address]    Special Requests   Final    Blood Culture adequate volume BOTTLES DRAWN AEROBIC AND ANAEROBIC Performed at [facility], [address] [place], [place] [address]    Culture   Final    NO GROWTH 5 DAYS Performed at [facility], [address], [place], [place] [address]    Report Status 06/27/2022 FINAL  Final  Surgical pcr screen     Status: None   Collection Time: 06/26/22  6:03 PM   Specimen: Nasal Mucosa; Nasal Swab  Result Value Ref Range Status   MRSA, PCR NEGATIVE NEGATIVE Final   Staphylococcus aureus NEGATIVE NEGATIVE Final    Comment: (NOTE) The Xpert SA Assay (FDA approved for NASAL specimens in patients 33 years of age and older), is one component of a comprehensive surveillance program. It is not intended to diagnose infection nor to guide or monitor treatment. Performed at [facility], [place] [address],  [place], [place] [address]      Time coordinating discharge: Over 30 minutes  SIGNED:   Little Ishikawa, DO [facility] 07/01/2022, 8:08 AM Pager   If 7PM-7AM, please contact night-coverage [email or web address]

## 2022-07-01 NOTE — Plan of Care (Signed)
  Problem: Education: Goal: Knowledge of General Education information will improve Description: Including pain rating scale, medication(s)/side effects and non-pharmacologic comfort measures Outcome: Progressing   Problem: Health Behavior/Discharge Planning: Goal: Ability to manage health-related needs will improve Outcome: Progressing   Problem: Clinical Measurements: Goal: Ability to maintain clinical measurements within normal limits will improve Outcome: Progressing Goal: Will remain free from infection Outcome: Progressing Goal: Diagnostic test results will improve Outcome: Progressing Goal: Respiratory complications will improve Outcome: Progressing Goal: Cardiovascular complication will be avoided Outcome: Progressing   Problem: Activity: Goal: Risk for activity intolerance will decrease Outcome: Progressing   Problem: Nutrition: Goal: Adequate nutrition will be maintained Outcome: Progressing   Problem: Coping: Goal: Level of anxiety will decrease Outcome: Progressing   Problem: Elimination: Goal: Will not experience complications related to bowel motility Outcome: Progressing Goal: Will not experience complications related to urinary retention Outcome: Progressing   Problem: Pain Managment: Goal: General experience of comfort will improve Outcome: Progressing   Problem: Safety: Goal: Ability to remain free from injury will improve Outcome: Progressing   Problem: Skin Integrity: Goal: Risk for impaired skin integrity will decrease Outcome: Progressing   Problem: Education: Goal: Ability to describe self-care measures that may prevent or decrease complications (Diabetes Survival Skills Education) will improve Outcome: Progressing Goal: Individualized Educational Video(s) Outcome: Progressing   Problem: Coping: Goal: Ability to adjust to condition or change in health will improve Outcome: Progressing   Problem: Fluid Volume: Goal: Ability to  maintain a balanced intake and output will improve Outcome: Progressing   Problem: Health Behavior/Discharge Planning: Goal: Ability to identify and utilize available resources and services will improve Outcome: Progressing Goal: Ability to manage health-related needs will improve Outcome: Progressing   Problem: Metabolic: Goal: Ability to maintain appropriate glucose levels will improve Outcome: Progressing   Problem: Nutritional: Goal: Maintenance of adequate nutrition will improve Outcome: Progressing Goal: Progress toward achieving an optimal weight will improve Outcome: Progressing   Problem: Skin Integrity: Goal: Risk for impaired skin integrity will decrease Outcome: Progressing   Problem: Tissue Perfusion: Goal: Adequacy of tissue perfusion will improve Outcome: Progressing   Problem: Education: Goal: Knowledge of the prescribed therapeutic regimen will improve Outcome: Progressing Goal: Ability to verbalize activity precautions or restrictions will improve Outcome: Progressing Goal: Understanding of discharge needs will improve Outcome: Progressing   Problem: Activity: Goal: Ability to perform//tolerate increased activity and mobilize with assistive devices will improve Outcome: Progressing   Problem: Clinical Measurements: Goal: Postoperative complications will be avoided or minimized Outcome: Progressing   Problem: Self-Care: Goal: Ability to meet self-care needs will improve Outcome: Progressing   Problem: Self-Concept: Goal: Ability to maintain and perform role responsibilities to the fullest extent possible will improve Outcome: Progressing   Problem: Pain Management: Goal: Pain level will decrease with appropriate interventions Outcome: Progressing   Problem: Skin Integrity: Goal: Demonstration of wound healing without infection will improve Outcome: Progressing   

## 2022-07-01 NOTE — TOC Transition Note (Signed)
Transition of Care [facility]) - CM/SW Discharge Note   Patient Details  Name: Jeffrey Fields MRN: [id number] Date of Birth: Oct 24, 1955  [facility]) CM/SW Contact:  Bartholomew Crews, RN Phone Number: [phone] 07/01/2022, 11:58 AM   Clinical Narrative:     Spoke with patient and spouse, [name], on hospital room phone to verify readiness to transport. PTAR arranged. Medical transport paperwork completed and sent to printer at front nursing station.     Barriers to Discharge: Continued Medical Work up   Patient Goals and CMS Choice        Discharge Placement                       Discharge Plan and Services                                     Social Determinants of Health (SDOH) Interventions     Readmission Risk Interventions     No data to display

## 2022-07-10 ENCOUNTER — Telehealth: Payer: Self-pay | Admitting: Orthopedic Surgery

## 2022-07-10 ENCOUNTER — Encounter: Payer: Medicare HMO | Admitting: Family

## 2022-07-10 NOTE — Telephone Encounter (Signed)
Please call pt and offer appt any time he is able to make it. Could you please see if he can use the [employer]. If you need me to open a time let me know. Thanks

## 2022-07-10 NOTE — Telephone Encounter (Signed)
Pt called need to cancel and to find new appt for post op. Pt states he lives in [place] and his ride is hard to bring him. Please call pt about this matter at [phone].

## 2022-07-11 ENCOUNTER — Telehealth: Payer: Self-pay | Admitting: Orthopedic Surgery

## 2022-07-11 NOTE — Telephone Encounter (Signed)
It looks like he is sch for Friday. Let see if he is able to maintain that appt and then go from there.

## 2022-07-11 NOTE — Telephone Encounter (Signed)
Called pt and left voicemail about taxi voucher. Please call Crystal when pt calls.Thanks

## 2022-07-13 ENCOUNTER — Encounter: Payer: Medicare HMO | Admitting: Family

## 2022-07-17 ENCOUNTER — Telehealth: Payer: Self-pay

## 2022-07-17 ENCOUNTER — Other Ambulatory Visit: Payer: Self-pay

## 2022-07-17 NOTE — Telephone Encounter (Signed)
Sonia Side with [facility] emailed and let me know that they are able to see the pt for [facility] and I have faxed over his d/c summary and order for nursing. They will call to get pt on schedule.

## 2022-07-17 NOTE — Telephone Encounter (Signed)
I have been monitoring the pt for office follow up and reached out o him today to see if he would be open to having HHN come out to the house to evaluate his incision and remove his staples. Pt is agreeable to this. He still declines PT and social work referral as he states that he can do his own therapy and that he has an aid that comes to his home daily. I sent a referral to [facility] to see if they can pick him up for nursing and will hold this message pending advisement.     Pt was not able to get into the [employer] without assistance and they are not able to assist pt. Possible home health eval for nursing, PT and community based programs?        Previous Messages    ----- Message -----  From: Pamella Pert, RMA  Sent: 07/11/2022   9:11 AM EDT  To: Pamella Pert, RMA  Subject: RE: kerecis                                     Pt cx appt on 10/3 due to transportation r/s for 10/6  ----- Message -----  From: Pamella Pert, RMA  Sent: 07/02/2022   1:11 PM EDT  To: Pamella Pert, RMA  Subject: RE: kerecis                                     Appt sch 07/10/2022  ----- Message -----  From: Pamella Pert, RMA  Sent: 07/02/2022   9:09 AM EDT  To: Pamella Pert, RMA  Subject: RE: kerecis                                     Pt d/c from the hospital yesterday and message to the front desk to call for appt next Monday or Tuesday.  ----- Message -----  From: Pamella Pert, RMA  Sent: 06/29/2022   1:19 PM EDT  To: Pamella Pert, RMA  Subject: RE: kerecis                                     Pt did not rec graft. Pt's family declined CIR placement and have advised case manager that they would like the pt to d/c home. Will hold and continue to monitor so that we can make an appt once he has left the hospital.  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

## 2022-07-18 ENCOUNTER — Telehealth: Payer: Self-pay | Admitting: *Deleted

## 2022-07-18 NOTE — Telephone Encounter (Signed)
Attempt x 2 to patient to discuss transportation needs. He has [employer] and Medicaid. Per Media tab in chart, there is a number for [facility] case management team. This may be beneficial in finding him transportation. Unable to leave a message with patient's phone number.

## 2022-07-18 NOTE — Telephone Encounter (Signed)
Patient called in asking when someone would be out to take out his staples. Attempted to speak with him about transportation and he didn't want to call CM for [employer] at this time. He just wanted someone to come take out his staples from his leg. I explained that someone from the Home health agency would be calling him soon to arrange a time/date. I called Sonia Side with [facility] and informed patient is waiting for phone call.

## 2022-07-18 NOTE — Telephone Encounter (Signed)
I just SW Jeffrey Fields as well and they are waiting for [facility] to contact them about [facility] services.

## 2022-08-23 ENCOUNTER — Telehealth: Payer: Self-pay | Admitting: Orthopedic Surgery

## 2022-08-23 NOTE — Telephone Encounter (Signed)
Spoke with Gabby-nurse with [facility] advised patient has missed his visit again this week. Joyice Faster advised it is hard to get in touch with patient. Joyice Faster said patient might be discharged  from nursing and (PT) due to non-compliance. The number to contact Joyice Faster is [phone]

## 2022-08-24 NOTE — Telephone Encounter (Signed)
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 He has never come into the office. Pt has refused due to transportation and when this was arranged for him he was not able to get in the taxi himself. I will call to see if I can schedule follow up and call the nurse as well to see if they have been able to see the pt at all

## 2022-08-27 ENCOUNTER — Telehealth: Payer: Self-pay | Admitting: Orthopedic Surgery

## 2022-08-27 NOTE — Telephone Encounter (Signed)
Allie (RN) from [facility] called to inform Dr Lajoyce Corners pt has been discharged form skilled nursing. If any questions call Allie at [phone].

## 2022-09-06 NOTE — Telephone Encounter (Signed)
Unable to reach pt has declined all visits

## 2023-05-10 ENCOUNTER — Emergency Department (HOSPITAL_COMMUNITY): Payer: Medicare HMO

## 2023-05-10 ENCOUNTER — Inpatient Hospital Stay (HOSPITAL_COMMUNITY)
Admission: EM | Admit: 2023-05-10 | Discharge: 2023-05-13 | DRG: 435 | Disposition: A | Discharge status: Hospice | Payer: Medicare HMO | Attending: Internal Medicine | Admitting: Internal Medicine

## 2023-05-10 ENCOUNTER — Encounter (HOSPITAL_COMMUNITY): Payer: Self-pay

## 2023-05-10 ENCOUNTER — Other Ambulatory Visit: Payer: Self-pay

## 2023-05-10 DIAGNOSIS — R112 Nausea with vomiting, unspecified: Secondary | ICD-10-CM | POA: Diagnosis not present

## 2023-05-10 DIAGNOSIS — K7689 Other specified diseases of liver: Secondary | ICD-10-CM

## 2023-05-10 DIAGNOSIS — I10 Essential (primary) hypertension: Secondary | ICD-10-CM | POA: Diagnosis present

## 2023-05-10 DIAGNOSIS — Z89611 Acquired absence of right leg above knee: Secondary | ICD-10-CM

## 2023-05-10 DIAGNOSIS — I252 Old myocardial infarction: Secondary | ICD-10-CM

## 2023-05-10 DIAGNOSIS — R1115 Cyclical vomiting syndrome unrelated to migraine: Secondary | ICD-10-CM | POA: Diagnosis present

## 2023-05-10 DIAGNOSIS — M6250 Muscle wasting and atrophy, not elsewhere classified, unspecified site: Secondary | ICD-10-CM | POA: Diagnosis present

## 2023-05-10 DIAGNOSIS — K5641 Fecal impaction: Secondary | ICD-10-CM

## 2023-05-10 DIAGNOSIS — K3189 Other diseases of stomach and duodenum: Secondary | ICD-10-CM | POA: Diagnosis present

## 2023-05-10 DIAGNOSIS — D649 Anemia, unspecified: Secondary | ICD-10-CM | POA: Diagnosis present

## 2023-05-10 DIAGNOSIS — G8220 Paraplegia, unspecified: Secondary | ICD-10-CM | POA: Diagnosis present

## 2023-05-10 DIAGNOSIS — R160 Hepatomegaly, not elsewhere classified: Secondary | ICD-10-CM | POA: Diagnosis present

## 2023-05-10 DIAGNOSIS — C801 Malignant (primary) neoplasm, unspecified: Secondary | ICD-10-CM | POA: Diagnosis present

## 2023-05-10 DIAGNOSIS — E278 Other specified disorders of adrenal gland: Secondary | ICD-10-CM | POA: Diagnosis present

## 2023-05-10 DIAGNOSIS — Z89511 Acquired absence of right leg below knee: Secondary | ICD-10-CM

## 2023-05-10 DIAGNOSIS — Z7189 Other specified counseling: Secondary | ICD-10-CM

## 2023-05-10 DIAGNOSIS — Z515 Encounter for palliative care: Secondary | ICD-10-CM

## 2023-05-10 DIAGNOSIS — I7121 Aneurysm of the ascending aorta, without rupture: Secondary | ICD-10-CM

## 2023-05-10 DIAGNOSIS — Z66 Do not resuscitate: Secondary | ICD-10-CM

## 2023-05-10 DIAGNOSIS — I480 Paroxysmal atrial fibrillation: Secondary | ICD-10-CM

## 2023-05-10 DIAGNOSIS — R54 Age-related physical debility: Secondary | ICD-10-CM | POA: Diagnosis present

## 2023-05-10 DIAGNOSIS — Z9861 Coronary angioplasty status: Secondary | ICD-10-CM

## 2023-05-10 DIAGNOSIS — R627 Adult failure to thrive: Secondary | ICD-10-CM | POA: Diagnosis present

## 2023-05-10 DIAGNOSIS — Z7401 Bed confinement status: Secondary | ICD-10-CM

## 2023-05-10 DIAGNOSIS — Z602 Problems related to living alone: Secondary | ICD-10-CM | POA: Diagnosis present

## 2023-05-10 DIAGNOSIS — R64 Cachexia: Secondary | ICD-10-CM | POA: Diagnosis present

## 2023-05-10 DIAGNOSIS — Z79899 Other long term (current) drug therapy: Secondary | ICD-10-CM

## 2023-05-10 DIAGNOSIS — E785 Hyperlipidemia, unspecified: Secondary | ICD-10-CM | POA: Diagnosis present

## 2023-05-10 DIAGNOSIS — I255 Ischemic cardiomyopathy: Secondary | ICD-10-CM

## 2023-05-10 DIAGNOSIS — I2510 Atherosclerotic heart disease of native coronary artery without angina pectoris: Secondary | ICD-10-CM

## 2023-05-10 DIAGNOSIS — R6510 Systemic inflammatory response syndrome (SIRS) of non-infectious origin without acute organ dysfunction: Secondary | ICD-10-CM

## 2023-05-10 DIAGNOSIS — L89154 Pressure ulcer of sacral region, stage 4: Secondary | ICD-10-CM

## 2023-05-10 DIAGNOSIS — I110 Hypertensive heart disease with heart failure: Secondary | ICD-10-CM | POA: Diagnosis present

## 2023-05-10 DIAGNOSIS — E611 Iron deficiency: Secondary | ICD-10-CM

## 2023-05-10 DIAGNOSIS — G893 Neoplasm related pain (acute) (chronic): Secondary | ICD-10-CM

## 2023-05-10 DIAGNOSIS — E1169 Type 2 diabetes mellitus with other specified complication: Secondary | ICD-10-CM | POA: Diagnosis present

## 2023-05-10 DIAGNOSIS — C649 Malignant neoplasm of unspecified kidney, except renal pelvis: Secondary | ICD-10-CM | POA: Diagnosis present

## 2023-05-10 DIAGNOSIS — C787 Secondary malignant neoplasm of liver and intrahepatic bile duct: Secondary | ICD-10-CM | POA: Diagnosis not present

## 2023-05-10 DIAGNOSIS — E119 Type 2 diabetes mellitus without complications: Secondary | ICD-10-CM

## 2023-05-10 DIAGNOSIS — C799 Secondary malignant neoplasm of unspecified site: Secondary | ICD-10-CM

## 2023-05-10 DIAGNOSIS — K311 Adult hypertrophic pyloric stenosis: Secondary | ICD-10-CM | POA: Diagnosis present

## 2023-05-10 DIAGNOSIS — I5022 Chronic systolic (congestive) heart failure: Secondary | ICD-10-CM | POA: Diagnosis present

## 2023-05-10 DIAGNOSIS — D72829 Elevated white blood cell count, unspecified: Secondary | ICD-10-CM | POA: Diagnosis present

## 2023-05-10 DIAGNOSIS — Z7902 Long term (current) use of antithrombotics/antiplatelets: Secondary | ICD-10-CM

## 2023-05-10 DIAGNOSIS — E860 Dehydration: Secondary | ICD-10-CM | POA: Diagnosis present

## 2023-05-10 DIAGNOSIS — C229 Malignant neoplasm of liver, not specified as primary or secondary: Principal | ICD-10-CM

## 2023-05-10 DIAGNOSIS — M4800 Spinal stenosis, site unspecified: Secondary | ICD-10-CM

## 2023-05-10 DIAGNOSIS — R000 Tachycardia, unspecified: Secondary | ICD-10-CM | POA: Diagnosis present

## 2023-05-10 DIAGNOSIS — M869 Osteomyelitis, unspecified: Secondary | ICD-10-CM | POA: Diagnosis present

## 2023-05-10 DIAGNOSIS — C772 Secondary and unspecified malignant neoplasm of intra-abdominal lymph nodes: Secondary | ICD-10-CM

## 2023-05-10 DIAGNOSIS — D509 Iron deficiency anemia, unspecified: Secondary | ICD-10-CM | POA: Diagnosis present

## 2023-05-10 DIAGNOSIS — F419 Anxiety disorder, unspecified: Secondary | ICD-10-CM | POA: Diagnosis present

## 2023-05-10 DIAGNOSIS — Z6822 Body mass index (BMI) 22.0-22.9, adult: Secondary | ICD-10-CM

## 2023-05-10 DIAGNOSIS — E876 Hypokalemia: Secondary | ICD-10-CM | POA: Diagnosis present

## 2023-05-10 DIAGNOSIS — Z91199 Patient's noncompliance with other medical treatment and regimen due to unspecified reason: Secondary | ICD-10-CM

## 2023-05-10 DIAGNOSIS — M4628 Osteomyelitis of vertebra, sacral and sacrococcygeal region: Secondary | ICD-10-CM | POA: Diagnosis present

## 2023-05-10 DIAGNOSIS — Z7982 Long term (current) use of aspirin: Secondary | ICD-10-CM

## 2023-05-10 DIAGNOSIS — E1151 Type 2 diabetes mellitus with diabetic peripheral angiopathy without gangrene: Secondary | ICD-10-CM | POA: Diagnosis present

## 2023-05-10 DIAGNOSIS — R1114 Bilious vomiting: Secondary | ICD-10-CM

## 2023-05-10 LAB — URINALYSIS, W/ REFLEX TO CULTURE (INFECTION SUSPECTED)
Bilirubin Urine: NEGATIVE
Glucose, UA: NEGATIVE mg/dL
Ketones, ur: 5 mg/dL — AB
Nitrite: NEGATIVE
Protein, ur: 30 mg/dL — AB
RBC / HPF: >50 RBC/hpf (ref 0–5)
Specific Gravity, Urine: 1.015 (ref 1.005–1.030)
WBC, UA: >50 WBC/hpf (ref 0–5)
pH: 5 (ref 5.0–8.0)

## 2023-05-10 LAB — COMPREHENSIVE METABOLIC PANEL
ALT: 12 U/L (ref 0–44)
AST: 13 U/L — ABNORMAL LOW (ref 15–41)
Albumin: 2.4 g/dL — ABNORMAL LOW (ref 3.5–5.0)
Alkaline Phosphatase: 109 U/L (ref 38–126)
Anion gap: 16 — ABNORMAL HIGH (ref 5–15)
BUN: 27 mg/dL — ABNORMAL HIGH (ref 8–23)
CO2: 37 mmol/L — ABNORMAL HIGH (ref 22–32)
Calcium: 8.5 mg/dL — ABNORMAL LOW (ref 8.9–10.3)
Chloride: 87 mmol/L — ABNORMAL LOW (ref 98–111)
Creatinine, Ser: 0.74 mg/dL (ref 0.61–1.24)
GFR, Estimated: >60 mL/min (ref >60)
Glucose, Bld: 164 mg/dL — ABNORMAL HIGH (ref 70–99)
Potassium: 2.4 mmol/L — CL (ref 3.5–5.1)
Sodium: 140 mmol/L (ref 135–145)
Total Bilirubin: 0.9 mg/dL (ref 0.3–1.2)
Total Protein: 8.5 g/dL — ABNORMAL HIGH (ref 6.5–8.1)

## 2023-05-10 LAB — I-STAT CHEM 8, ED
BUN: 23 mg/dL (ref 8–23)
Calcium, Ion: 1 mmol/L — ABNORMAL LOW (ref 1.15–1.40)
Chloride: 87 mmol/L — ABNORMAL LOW (ref 98–111)
Creatinine, Ser: 1.1 mg/dL (ref 0.61–1.24)
Glucose, Bld: 159 mg/dL — ABNORMAL HIGH (ref 70–99)
HCT: 24 % — ABNORMAL LOW (ref 39.0–52.0)
Hemoglobin: 8.2 g/dL — ABNORMAL LOW (ref 13.0–17.0)
Potassium: 2.4 mmol/L — CL (ref 3.5–5.1)
Sodium: 138 mmol/L (ref 135–145)
TCO2: 39 mmol/L — ABNORMAL HIGH (ref 22–32)

## 2023-05-10 LAB — CBC WITH DIFFERENTIAL/PLATELET
Abs Immature Granulocytes: 0.05 10*3/uL (ref 0.00–0.07)
Basophils Absolute: 0 10*3/uL (ref 0.0–0.1)
Basophils Relative: 0 %
Eosinophils Absolute: 0 10*3/uL (ref 0.0–0.5)
Eosinophils Relative: 0 %
HCT: 27.1 % — ABNORMAL LOW (ref 39.0–52.0)
Hemoglobin: 7.6 g/dL — ABNORMAL LOW (ref 13.0–17.0)
Immature Granulocytes: 0 %
Lymphocytes Relative: 6 %
Lymphs Abs: 0.8 10*3/uL (ref 0.7–4.0)
MCH: 18.1 pg — ABNORMAL LOW (ref 26.0–34.0)
MCHC: 28 g/dL — ABNORMAL LOW (ref 30.0–36.0)
MCV: 64.7 fL — ABNORMAL LOW (ref 80.0–100.0)
Monocytes Absolute: 1 10*3/uL (ref 0.1–1.0)
Monocytes Relative: 7 %
Neutro Abs: 11.6 10*3/uL — ABNORMAL HIGH (ref 1.7–7.7)
Neutrophils Relative %: 87 %
Platelets: 673 10*3/uL — ABNORMAL HIGH (ref 150–400)
RBC: 4.19 MIL/uL — ABNORMAL LOW (ref 4.22–5.81)
RDW: 20.5 % — ABNORMAL HIGH (ref 11.5–15.5)
WBC: 13.5 10*3/uL — ABNORMAL HIGH (ref 4.0–10.5)
nRBC: 0 % (ref 0.0–0.2)

## 2023-05-10 LAB — RAPID URINE DRUG SCREEN, HOSP PERFORMED
Amphetamines: NOT DETECTED
Barbiturates: NOT DETECTED
Benzodiazepines: NOT DETECTED
Cocaine: NOT DETECTED
Opiates: NOT DETECTED
Tetrahydrocannabinol: POSITIVE — AB

## 2023-05-10 LAB — FERRITIN: Ferritin: 50 ng/mL (ref 24–336)

## 2023-05-10 LAB — MAGNESIUM: Magnesium: 2.3 mg/dL (ref 1.7–2.4)

## 2023-05-10 LAB — TYPE AND SCREEN

## 2023-05-10 LAB — TROPONIN I (HIGH SENSITIVITY)
Troponin I (High Sensitivity): 14 ng/L (ref <18)
Troponin I (High Sensitivity): 13 ng/L (ref <18)

## 2023-05-10 LAB — CBC
HCT: 21.6 % — ABNORMAL LOW (ref 39.0–52.0)
Hemoglobin: 6.1 g/dL — CL (ref 13.0–17.0)
MCH: 18.5 pg — ABNORMAL LOW (ref 26.0–34.0)
MCHC: 28.2 g/dL — ABNORMAL LOW (ref 30.0–36.0)
MCV: 65.5 fL — ABNORMAL LOW (ref 80.0–100.0)
Platelets: 557 10*3/uL — ABNORMAL HIGH (ref 150–400)
RBC: 3.3 MIL/uL — ABNORMAL LOW (ref 4.22–5.81)
RDW: 20.2 % — ABNORMAL HIGH (ref 11.5–15.5)
WBC: 13.2 10*3/uL — ABNORMAL HIGH (ref 4.0–10.5)
nRBC: 0 % (ref 0.0–0.2)

## 2023-05-10 LAB — LACTIC ACID, PLASMA: Lactic Acid, Venous: 1.7 mmol/L (ref 0.5–1.9)

## 2023-05-10 LAB — CBG MONITORING, ED: Glucose-Capillary: 115 mg/dL — ABNORMAL HIGH (ref 70–99)

## 2023-05-10 LAB — LIPASE, BLOOD: Lipase: 28 U/L (ref 11–51)

## 2023-05-10 LAB — IRON AND TIBC
Iron: 9 ug/dL — ABNORMAL LOW (ref 45–182)
Saturation Ratios: 5 % — ABNORMAL LOW (ref 17.9–39.5)
TIBC: 169 ug/dL — ABNORMAL LOW (ref 250–450)
UIBC: 160 ug/dL

## 2023-05-10 LAB — SEDIMENTATION RATE: Sed Rate: 107 mm/hr — ABNORMAL HIGH (ref 0–16)

## 2023-05-10 MED ORDER — METOPROLOL TARTRATE 50 MG PO TABS
50.0000 mg | ORAL_TABLET | Freq: Two times a day (BID) | ORAL | Status: DC
  Administered 2023-05-11 – 2023-05-13 (×6): 50 mg via ORAL
  Filled 2023-05-10: qty 1
  Filled 2023-05-10: qty 2
  Filled 2023-05-10: qty 1
  Filled 2023-05-10: qty 2
  Filled 2023-05-10 (×2): qty 1

## 2023-05-10 MED ORDER — IOHEXOL 350 MG/ML SOLN
100.0000 mL | Freq: Once | INTRAVENOUS | Status: AC | PRN
  Administered 2023-05-10: 100 mL via INTRAVENOUS

## 2023-05-10 MED ORDER — ACETAMINOPHEN 325 MG PO TABS: 650.0000 mg | ORAL_TABLET | Freq: Four times a day (QID) | ORAL | Status: DC | PRN

## 2023-05-10 MED ORDER — SODIUM CHLORIDE 0.9 % IV SOLN
1.0000 g | INTRAVENOUS | Status: DC
  Administered 2023-05-11 – 2023-05-12 (×2): 1 g via INTRAVENOUS
  Filled 2023-05-10 (×2): qty 10

## 2023-05-10 MED ORDER — OXYCODONE-ACETAMINOPHEN 7.5-325 MG PO TABS
1.0000 | ORAL_TABLET | Freq: Four times a day (QID) | ORAL | Status: DC | PRN
  Administered 2023-05-10 – 2023-05-11 (×2): 1 via ORAL
  Filled 2023-05-10 (×2): qty 1

## 2023-05-10 MED ORDER — FAMOTIDINE 20 MG PO TABS: 40.0000 mg | ORAL_TABLET | Freq: Every day | ORAL | Status: DC

## 2023-05-10 MED ORDER — FLEET ENEMA 7-19 GM/118ML RE ENEM
1.0000 | ENEMA | Freq: Once | RECTAL | Status: AC
  Administered 2023-05-10: 1 via RECTAL
  Filled 2023-05-10: qty 1

## 2023-05-10 MED ORDER — ONDANSETRON HCL 4 MG/2ML IJ SOLN
4.0000 mg | Freq: Four times a day (QID) | INTRAMUSCULAR | Status: DC | PRN
  Administered 2023-05-11 – 2023-05-12 (×3): 4 mg via INTRAVENOUS
  Filled 2023-05-10 (×3): qty 2

## 2023-05-10 MED ORDER — POTASSIUM CHLORIDE 10 MEQ/100ML IV SOLN
10.0000 meq | INTRAVENOUS | Status: AC
  Administered 2023-05-10 (×4): 10 meq via INTRAVENOUS
  Filled 2023-05-10 (×4): qty 100

## 2023-05-10 MED ORDER — ONDANSETRON HCL 4 MG/2ML IJ SOLN
4.0000 mg | Freq: Once | INTRAMUSCULAR | Status: AC
  Administered 2023-05-10: 4 mg via INTRAVENOUS
  Filled 2023-05-10: qty 2

## 2023-05-10 MED ORDER — PANTOPRAZOLE SODIUM 40 MG IV SOLR
40.0000 mg | Freq: Two times a day (BID) | INTRAVENOUS | Status: DC
  Administered 2023-05-10 – 2023-05-12 (×5): 40 mg via INTRAVENOUS
  Filled 2023-05-10 (×4): qty 10

## 2023-05-10 MED ORDER — PANTOPRAZOLE SODIUM 40 MG IV SOLR: 40.0000 mg | INTRAVENOUS | Status: DC

## 2023-05-10 MED ORDER — ACETAMINOPHEN 500 MG PO TABS
1000.0000 mg | ORAL_TABLET | Freq: Once | ORAL | Status: DC
  Filled 2023-05-10: qty 2

## 2023-05-10 MED ORDER — SODIUM CHLORIDE 0.9% IV SOLUTION: Freq: Once | INTRAVENOUS | Status: AC

## 2023-05-10 MED ORDER — INSULIN ASPART 100 UNIT/ML IJ SOLN
0.0000 [IU] | Freq: Three times a day (TID) | INTRAMUSCULAR | Status: DC
  Filled 2023-05-10: qty 0.09

## 2023-05-10 MED ORDER — SODIUM CHLORIDE 0.9 % IV SOLN
1.0000 g | Freq: Once | INTRAVENOUS | Status: AC
  Administered 2023-05-10: 1 g via INTRAVENOUS
  Filled 2023-05-10: qty 10

## 2023-05-10 MED ORDER — POTASSIUM CHLORIDE CRYS ER 20 MEQ PO TBCR
40.0000 meq | EXTENDED_RELEASE_TABLET | Freq: Once | ORAL | Status: AC
  Administered 2023-05-10: 40 meq via ORAL
  Filled 2023-05-10: qty 2

## 2023-05-10 MED ORDER — DOCUSATE SODIUM 100 MG PO CAPS
100.0000 mg | ORAL_CAPSULE | Freq: Every day | ORAL | Status: DC
  Administered 2023-05-11: 100 mg via ORAL
  Filled 2023-05-10: qty 1

## 2023-05-10 MED ORDER — ONDANSETRON HCL 4 MG PO TABS: 4.0000 mg | ORAL_TABLET | Freq: Four times a day (QID) | ORAL | Status: DC | PRN

## 2023-05-10 MED ORDER — SODIUM CHLORIDE 0.9 % IV SOLN: INTRAVENOUS | Status: DC

## 2023-05-10 MED ORDER — PANTOPRAZOLE SODIUM 40 MG IV SOLR
40.0000 mg | INTRAVENOUS | Status: DC
  Filled 2023-05-10: qty 10

## 2023-05-10 MED ORDER — ACETAMINOPHEN 650 MG RE SUPP: 650.0000 mg | Freq: Four times a day (QID) | RECTAL | Status: DC | PRN

## 2023-05-10 MED ORDER — PANTOPRAZOLE SODIUM 40 MG IV SOLR
40.0000 mg | Freq: Once | INTRAVENOUS | Status: AC
  Administered 2023-05-10: 40 mg via INTRAVENOUS
  Filled 2023-05-10: qty 10

## 2023-05-10 MED ORDER — SODIUM CHLORIDE 0.9 % IV BOLUS
1000.0000 mL | Freq: Once | INTRAVENOUS | Status: AC
  Administered 2023-05-10: 1000 mL via INTRAVENOUS

## 2023-05-10 NOTE — Assessment & Plan Note (Signed)
Continue home pain medication  Getting percocet from a physician in [facility] per PMP review

## 2023-05-10 NOTE — Assessment & Plan Note (Addendum)
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 Will give judicial, time limited IVF with known ischemic CM

## 2023-05-10 NOTE — Assessment & Plan Note (Addendum)
Appears dry to euvolemic on exam  EF previously 25 to 30%  Echo in 06/2022 showed EF 35 to 40%.  Global hypokinesis noted.  Diastolic dysfunction also seen. He is supposed to be followed by Dr. Sedalia Muta but has had no cardiology f/u since NSTEMI in 11/2021 Unsure if taking lasix, not taking farxiga On metoprolol, states he is not taking his lisinopril Would avoid CCB

## 2023-05-10 NOTE — Assessment & Plan Note (Signed)
Unsure what medication he is taking vs. Not taking Will do lopressor 50mg  BID for now

## 2023-05-10 NOTE — Consult Note (Signed)
I am asked to see [name].  He is a very unfortunate 67 year old African-American male old African-American male.  He comes in with nausea and vomiting and incredibly poor performance status.  He subsequently was found to have extensive liver mets.  He obviously has not been seen by a physician for quite a while.  He has been at home.  I am still not sure how he gets around.  He has a right lower leg amputation.  I suspect this probably from vascular disease.  He has a open decubitus in the sacral area.  He came in because he has not been able to eat.  He has had nausea and vomiting.  He had a CT scan that was done.  This was abdomen pelvis.  This showed numerous large masses in the liver.  The largest in the right lobe measuring 22.4 x 16.6 cm.  He has a large soft tissue mass in the left suprarenal fossa measuring 7.2 x 4.8 cm.  This may be the left adrenal gland.  He has a mass in the upper pole of the right kidney measuring 3.1 x 2.7 cm.  CT angiogram of the chest was unremarkable for any obvious metastatic disease or primary.  There is no adenopathy.  He says he does not smoke.  He says he has occasional alcohol.  He has not had COVID.  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  Unfortunately, his performance status is no better than ECOG 3.  His labs today show sodium 130.  Potassium 2.4.  BUN 27 creatinine 0.74.  Glucose 164.  Calcium 8.5 with an albumin of 2.4.  SGPT was 12 SGOT 13.  His white cell count is 13.5.  Hemoglobin 7.6.  MCV is 65.  Platelet count 673,000.  Again, he has a very poor performance status.  I think that no matter what we find, he is not a candidate for systemic therapy.  He does seem to understand this.  I do think we will going to have to probably get a biopsy to see what might be going on.  He may have a primary kidney that is metastasized to the liver.   His vital signs are temperature of 98.2.  Pulse 110.  Blood pressure 156/88.  Weight is 183 pounds.  His  abdominal exam shows hepatomegaly.  There may be a fluid wave.  I do not feel any spleen tip.  Head and neck exam shows no adenopathy.  He has muscle wasting.  Lungs are relatively clear bilaterally.  Cardiac exam regular rate rhythm.  Extremities shows the right BKA.  His left leg has muscle atrophy.  Neurological exam is really nonfocal.   [name] is a nice 67 year old African-American male year old African-American male.  Unfortunate, he has a very difficult problem.  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  It could be a sarcomatoid variant.  I suspect we probably will get a biopsy to see what is going on.  He clearly is iron deficient.  He is not quite anemic.  I suppose he may have a hemoglobinopathy that could account for the low MCV.  I am not sure if he has ever had a colonoscopy or upper endoscopy.  Again I suspect that we will get end up with Palliative Care seeing him and think about Hospice for him.  I have to believe that our goal here is quality of life.  He does seem quite nice.  Again I think he is  somewhat cognizant of the seriousness of his problem.  We will follow along and I suspect the biopsy will be done on Monday.   Christin Bach, MD  Romans 1:16

## 2023-05-10 NOTE — Assessment & Plan Note (Addendum)
Sacral decubitus ulcer, with evidence of underlying chronic osteomyelitis. Wound care consult  Likely stage 3-4 ulcer Inflammatory markers checked, but likely elevated with other disease processes On rocephin, ID consult tomorrow for oral abx/suppressive medication

## 2023-05-10 NOTE — ED Notes (Signed)
IStat K- 2.4 Paramedic and Provider notified

## 2023-05-10 NOTE — Assessment & Plan Note (Signed)
New findings of solid mass upper pole of right kidney suspicious for RCC with liver masses, adrenal masses.  Mass effect of proximal duodenum by enlarging right lobe liver mass with marked stomach distention.  NG tube ordered, but patient refused. He has had no more episodes of vomiting Oncology has been consulted

## 2023-05-10 NOTE — Assessment & Plan Note (Signed)
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

## 2023-05-10 NOTE — Assessment & Plan Note (Addendum)
Baseline hgb appears to be 7-8 over last 6 months. With cardiac history would transfuse for hgb <8.0 Type and screen Repeat CBC and transfuse if hgb <8, consents to transfusion  Check iron studies and fecal occult  Serial CBC

## 2023-05-10 NOTE — Assessment & Plan Note (Addendum)
67 year old with 3-4 day history of emesis and stomach pain found to have metastatic disease with mass effect of proximal duodenum by enlarging right lobe liver mass with marked stomach distention  -obs to tele  -NG tube ordered, but patient refused -has been tolerating liquids with no vomiting -PPI q 12 hours  -continue anti-emetics. Will continue liquid diet  -goal will be plan of care for his newly diagnosed metastatic disease. With extensive disease, poor overall status will have palliative care consulted and f/u on oncology recommendations

## 2023-05-10 NOTE — Assessment & Plan Note (Signed)
4.1cm ascending thoracic aortic aneurysm.  Per radiology guidelines, recommend annual f/u with CTA or MRA

## 2023-05-10 NOTE — Assessment & Plan Note (Addendum)
Check A1C (4.9 in September of 2023)  Sensitive SSI and accuchecks qac/hs

## 2023-05-10 NOTE — Assessment & Plan Note (Deleted)
EF previously 25 to 30%  Currently on metoprolol lisinopril.  His Marcelline Deist is on hold. Not on diuretics outpatient Echo here shows EF has improved to 35 to 40%.  Global hypokinesis noted.  Diastolic dysfunction also seen. He is supposed to be followed by Dr. Sedalia Muta.

## 2023-05-10 NOTE — ED Notes (Signed)
Patient refusing NG. Stating he has had them attempted before and they could never get them in. MD made aware.

## 2023-05-10 NOTE — ED Provider Notes (Addendum)
[place] EMERGENCY DEPARTMENT AT [facility] Provider Note   CSN: [id number] Arrival date & time: 05/10/23  1511     History  Chief Complaint  Patient presents with   Emesis    Jeffrey [name] is a 67 y.o. male w/ hx of paraplegia 2/2 spinal stenosis, s/p R AKA, HFrEF, PAF, chronic sacral pressure wound, T2DM, HLD, HTN, chronic anemia that p/w emesis.   Started throwing up for past 3-4 days. Not been able to eat much, but able to drink some fluids. Vomit appears brown, denies blood. Denies diarrhea. Reports constipation, has been taking senna. Wife said he had a BM this morning, but pt unsure of the appearance. Has not been able to take meds due to the vomiting. Feels like he has heartburn as well. Also having diffuse intermittent abm pain, which occurs after he vomits for a while.   Denies fevers. He has a urinary cath. Reports SOB of breath for past few days. Denies chest pain. No one else at home is sick, no recent travel. Denies hx of abm surgeries   Emesis Associated symptoms: abdominal pain   Associated symptoms: no diarrhea and no fever       Home Medications Prior to Admission medications   Medication Sig Start Date End Date Taking? Authorizing Provider  acetaminophen (TYLENOL) 325 MG tablet Take 1-2 tablets (325-650 mg total) by mouth every 6 (six) hours as needed for mild pain (pain score 1-3 or temp > 100.5). 07/01/22   Azucena Fallen, MD  amLODipine (NORVASC) 10 MG tablet Take 10 mg by mouth daily.    [provider]  aspirin EC 81 MG tablet Take 1 tablet (81 mg total) by mouth daily. Swallow whole. 07/01/22   Azucena Fallen, MD  clopidogrel (PLAVIX) 75 MG tablet Take 1 tablet (75 mg total) by mouth daily. 07/01/22   Azucena Fallen, MD  collagenase (SANTYL) 250 UNIT/GM ointment Apply topically daily. 07/01/22   Azucena Fallen, MD  docusate sodium (COLACE) 100 MG capsule Take 1 capsule (100 mg total) by mouth daily. 07/01/22    Azucena Fallen, MD  FARXIGA 10 MG TABS tablet Take 10 mg by mouth daily. 04/24/22   [provider]  furosemide (LASIX) 20 MG tablet Take 20 mg by mouth daily. 06/09/14   [provider]  lactose free nutrition (BOOST) LIQD Take 237 mLs by mouth in the morning and at bedtime.    [provider]  lisinopril (ZESTRIL) 10 MG tablet Take 10 mg by mouth 2 (two) times daily. 05/29/22   [provider]  metoprolol (LOPRESSOR) 50 MG tablet Take 50 mg by mouth 2 (two) times daily.    [provider]  nitroGLYCERIN (NITROSTAT) 0.4 MG SL tablet Place 0.4 mg under the tongue every 5 (five) minutes as needed for chest pain. 06/01/22   [provider]  oxyCODONE-acetaminophen (PERCOCET) 7.5-325 MG per tablet Take 1 tablet by mouth every 6 (six) hours as needed for moderate pain. 08/20/14   [provider]  pantoprazole (PROTONIX) 40 MG tablet Take 1 tablet (40 mg total) by mouth daily. 07/01/22   Azucena Fallen, MD  polyethylene glycol (MIRALAX / GLYCOLAX) 17 g packet Take 17 g by mouth daily as needed for mild constipation. 07/01/22   Azucena Fallen, MD  potassium chloride SA (KLOR-CON M) 20 MEQ tablet Take 20 mEq by mouth daily. 04/24/22   [provider]  rosuvastatin (CRESTOR) 10 MG tablet Take 10  mg by mouth 2 (two) times a week.    [provider]  SSD 1 % cream Apply 1 Application topically daily. 04/24/22   [provider]      Allergies    Patient has no known allergies.    Review of Systems   Review of Systems  Constitutional:  Negative for fever.  Respiratory:  Positive for shortness of breath.   Gastrointestinal:  Positive for abdominal pain, constipation, nausea and vomiting. Negative for diarrhea.   Physical Exam Updated Vital Signs BP (!) 156/88   Pulse (!) 110   Temp 98.2 F (36.8 C) (Oral)   Resp (!) 24   Ht 6\' 3"  (1.905 m)   Wt 83.1 kg   SpO2 100%   BMI 22.90 kg/m  Physical  Exam Gen: Alert, pleasant older man laying in bed. Speaking in full sentences on RA. NAD. HEENT: NCAT. MMM. CV: Rapid rate, regular rhythm. No murmurs. Cap refill <2.  Resp: CTAB. Normal WOB on RA. Abm: Soft, nondistended, nontender to palpation. Normal BS. No rebound, guarding, or rigidity.  Skin: Stage 4 sacral pressure wound, bandaged and no active bleeding. Decreased skin turgor.   ED Results / Procedures / Treatments   Labs (all labs ordered are listed, but only abnormal results are displayed) Labs Reviewed  CBC WITH DIFFERENTIAL/PLATELET - Abnormal; Notable for the following components:      Result Value   WBC 13.5 (*)    RBC 4.19 (*)    Hemoglobin 7.6 (*)    HCT 27.1 (*)    MCV 64.7 (*)    MCH 18.1 (*)    MCHC 28.0 (*)    RDW 20.5 (*)    Platelets 673 (*)    Neutro Abs 11.6 (*)    All other components within normal limits  COMPREHENSIVE METABOLIC PANEL - Abnormal; Notable for the following components:   Potassium 2.4 (*)    Chloride 87 (*)    CO2 37 (*)    Glucose, Bld 164 (*)    BUN 27 (*)    Calcium 8.5 (*)    Total Protein 8.5 (*)    Albumin 2.4 (*)    AST 13 (*)    Anion gap 16 (*)    All other components within normal limits  URINALYSIS, W/ REFLEX TO CULTURE (INFECTION SUSPECTED) - Abnormal; Notable for the following components:   Color, Urine AMBER (*)    APPearance CLOUDY (*)    Hgb urine dipstick MODERATE (*)    Ketones, ur 5 (*)    Protein, ur 30 (*)    Leukocytes,Ua LARGE (*)    Bacteria, UA MANY (*)    All other components within normal limits  I-STAT CHEM 8, ED - Abnormal; Notable for the following components:   Potassium 2.4 (*)    Chloride 87 (*)    Glucose, Bld 159 (*)    Calcium, Ion 1.00 (*)    TCO2 39 (*)    Hemoglobin 8.2 (*)    HCT 24.0 (*)    All other components within normal limits  URINE CULTURE  LIPASE, BLOOD  MAGNESIUM  HEMOGLOBIN A1C  TROPONIN I (HIGH SENSITIVITY)  TROPONIN I (HIGH SENSITIVITY)    EKG EKG  Interpretation Date/Time:  Friday May 10 2023 15:31:07 EDT Ventricular Rate:  133 PR Interval:  115 QRS Duration:  103 QT Interval:  320 QTC Calculation: 476 R Axis:   66  Text Interpretation: Sinus tachycardia Multiform ventricular premature complexes LVH with secondary  repolarization abnormality Anterior Q waves, possibly due to LVH Since last tracing rate faster Confirmed by Richardean Canal [phone]) on 05/10/2023 3:44:01 PM  Radiology CT ABDOMEN PELVIS W CONTRAST  Result Date: 05/10/2023 CLINICAL DATA:  Abdominal pain, nausea and vomiting, diarrhea for 2 days, abdominal distension EXAM: CT ANGIOGRAPHY CHEST CT ABDOMEN AND PELVIS WITH CONTRAST TECHNIQUE: Multidetector CT imaging of the chest was performed using the standard protocol during bolus administration of intravenous contrast. Multiplanar CT image reconstructions and MIPs were obtained to evaluate the vascular anatomy. Multidetector CT imaging of the abdomen and pelvis was performed using the standard protocol during bolus administration of intravenous contrast. RADIATION DOSE REDUCTION: This exam was performed according to the departmental dose-optimization program which includes automated exposure control, adjustment of the mA and/or kV according to patient size and/or use of iterative reconstruction technique. CONTRAST:  OMNIPAQUE IOHEXOL 350 MG/ML SOLN COMPARISON:  11/26/2021 FINDINGS: CTA CHEST FINDINGS Cardiovascular: This is a technically adequate evaluation of the pulmonary vasculature. No filling defects or pulmonary emboli. Prominent left ventricular dilatation again noted. No pericardial effusion. Ascending thoracic aortic aneurysm measuring up to 4.1 cm. No evidence of dissection. Atherosclerosis of the aorta and coronary vasculature. Mediastinum/Nodes: No enlarged mediastinal, hilar, or axillary lymph nodes. Thyroid gland, trachea, and esophagus demonstrate no significant findings. Lungs/Pleura: No acute airspace disease,  effusion, or pneumothorax. Central airways are patent. Musculoskeletal: No acute or destructive bony abnormalities. Reconstructed images demonstrate no additional findings. Review of the MIP images confirms the above findings. CT ABDOMEN and PELVIS FINDINGS Hepatobiliary: Since the prior exam, numerous large hypodense masses have replace the majority of the liver parenchyma, consistent with progressive primary malignancy or worsening metastatic disease. Largest mass occupying the majority of the right lobe liver image 45/2 measures 22.4 x 16.6 cm. Gallbladder is unremarkable. No biliary duct dilation. Pancreas: Unremarkable. No pancreatic ductal dilatation or surrounding inflammatory changes. Spleen: Normal in size without focal abnormality. Adrenals/Urinary Tract: There is a large soft tissue mass in the left suprarenal fossa, which may be associated with the lateral limb of the left adrenal gland, measuring up to 7.2 x 4.8 cm reference image 20/2, consistent with metastatic disease. The right adrenal appears unremarkable. There is a solid mass within the upper pole right kidney measuring 3.1 x 2.7 cm, concerning for renal cell carcinoma. This is not changed significantly in size since prior study. There are numerous other hypodensity seen throughout the bilateral renal parenchyma, most too small to characterize accurately. There are bilateral nonobstructing renal calculi, measuring up to 9 mm on the right and 11 mm on the left. No obstructive uropathy within either kidney. Chronic bladder wall thickening, trabeculations, and multiple diverticula consistent with chronic bladder outlet obstruction. Stomach/Bowel: There is a large amount of stool within the rectosigmoid colon, consistent with constipation. A large amount of stool in the rectal vault may reflect fecal impaction. No evidence of bowel obstruction or ileus. There is marked distension of the stomach. This may be due to extrinsic compression upon the  proximal duodenum by the enlarging hepatic masses described above. Decompression with enteric catheter may be useful. Vascular/Lymphatic: Aortic atherosclerosis. No discrete adenopathy identified within the abdomen or pelvis. Reproductive: The prostate is not enlarged. Other: There is no free fluid or free intraperitoneal gas. No abdominal wall hernia. Musculoskeletal: Progressive decubitus ulceration overlying the sacrum and coccyx. Underlying bony erosion and sclerosis consistent with osteomyelitis, likely chronic. There are no acute displaced fractures. Severe degenerative changes of the lumbar spine and bilateral hips.  Reconstructed images demonstrate no additional findings. Review of the MIP images confirms the above findings. IMPRESSION: Chest: 1. No evidence of pulmonary embolus. 2. No acute intrathoracic process. 3. 4.1 cm ascending thoracic aortic aneurysm. Recommend annual imaging followup by CTA or MRA. This recommendation follows 2010 ACCF/AHA/AATS/ACR/ASA/SCA/SCAI/SIR/STS/SVM Guidelines for the Diagnosis and Management of Patients with Thoracic Aortic Disease. Circulation. 2010; 121: W098-J191. Aortic aneurysm NOS (ICD10-I71.9) Abdomen/pelvis: 1. New and enlarging masses throughout the liver, consistent with progressive metastatic disease or worsening primary liver malignancy since prior exam. 2. Mass effect on the proximal duodenum by the enlarging right lobe liver mass described above, with marked distension of the stomach. Decompression of the stomach with enteric catheter may be useful. 3. 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 4. Solid mass upper pole right kidney suspicious for renal cell carcinoma. Numerous other hypodensities throughout the liver parenchyma are too small to characterize. 5. Sacral decubitus ulcer, with evidence of underlying chronic osteomyelitis. 6. Large amount of retained stool within the distal colon consistent with  constipation, with stool ball in the rectal vault concerning for fecal impaction. No bowel obstruction. 7. Bilateral nonobstructing renal calculi. 8.  Aortic Atherosclerosis (ICD10-I70.0). 9. Chronic bladder wall thickening, trabeculations, and diverticula consistent with chronic bladder outlet obstruction. Electronically Signed   By: Sharlet Salina M.D.   On: 05/10/2023 18:12   CT Angio Chest PE W and/or Wo Contrast  Result Date: 05/10/2023 CLINICAL DATA:  Abdominal pain, nausea and vomiting, diarrhea for 2 days, abdominal distension EXAM: CT ANGIOGRAPHY CHEST CT ABDOMEN AND PELVIS WITH CONTRAST TECHNIQUE: Multidetector CT imaging of the chest was performed using the standard protocol during bolus administration of intravenous contrast. Multiplanar CT image reconstructions and MIPs were obtained to evaluate the vascular anatomy. Multidetector CT imaging of the abdomen and pelvis was performed using the standard protocol during bolus administration of intravenous contrast. RADIATION DOSE REDUCTION: This exam was performed according to the departmental dose-optimization program which includes automated exposure control, adjustment of the mA and/or kV according to patient size and/or use of iterative reconstruction technique. CONTRAST:  OMNIPAQUE IOHEXOL 350 MG/ML SOLN COMPARISON:  11/26/2021 FINDINGS: CTA CHEST FINDINGS Cardiovascular: This is a technically adequate evaluation of the pulmonary vasculature. No filling defects or pulmonary emboli. Prominent left ventricular dilatation again noted. No pericardial effusion. Ascending thoracic aortic aneurysm measuring up to 4.1 cm. No evidence of dissection. Atherosclerosis of the aorta and coronary vasculature. Mediastinum/Nodes: No enlarged mediastinal, hilar, or axillary lymph nodes. Thyroid gland, trachea, and esophagus demonstrate no significant findings. Lungs/Pleura: No acute airspace disease, effusion, or pneumothorax. Central airways are patent.  Musculoskeletal: No acute or destructive bony abnormalities. Reconstructed images demonstrate no additional findings. Review of the MIP images confirms the above findings. CT ABDOMEN and PELVIS FINDINGS Hepatobiliary: Since the prior exam, numerous large hypodense masses have replace the majority of the liver parenchyma, consistent with progressive primary malignancy or worsening metastatic disease. Largest mass occupying the majority of the right lobe liver image 45/2 measures 22.4 x 16.6 cm. Gallbladder is unremarkable. No biliary duct dilation. Pancreas: Unremarkable. No pancreatic ductal dilatation or surrounding inflammatory changes. Spleen: Normal in size without focal abnormality. Adrenals/Urinary Tract: There is a large soft tissue mass in the left suprarenal fossa, which may be associated with the lateral limb of the left adrenal gland, measuring up to 7.2 x 4.8 cm reference image 20/2, consistent with metastatic disease. The right adrenal appears unremarkable. There is a solid mass within the  upper pole right kidney measuring 3.1 x 2.7 cm, concerning for renal cell carcinoma. This is not changed significantly in size since prior study. There are numerous other hypodensity seen throughout the bilateral renal parenchyma, most too small to characterize accurately. There are bilateral nonobstructing renal calculi, measuring up to 9 mm on the right and 11 mm on the left. No obstructive uropathy within either kidney. Chronic bladder wall thickening, trabeculations, and multiple diverticula consistent with chronic bladder outlet obstruction. Stomach/Bowel: There is a large amount of stool within the rectosigmoid colon, consistent with constipation. A large amount of stool in the rectal vault may reflect fecal impaction. No evidence of bowel obstruction or ileus. There is marked distension of the stomach. This may be due to extrinsic compression upon the proximal duodenum by the enlarging hepatic masses described  above. Decompression with enteric catheter may be useful. Vascular/Lymphatic: Aortic atherosclerosis. No discrete adenopathy identified within the abdomen or pelvis. Reproductive: The prostate is not enlarged. Other: There is no free fluid or free intraperitoneal gas. No abdominal wall hernia. Musculoskeletal: Progressive decubitus ulceration overlying the sacrum and coccyx. Underlying bony erosion and sclerosis consistent with osteomyelitis, likely chronic. There are no acute displaced fractures. Severe degenerative changes of the lumbar spine and bilateral hips. Reconstructed images demonstrate no additional findings. Review of the MIP images confirms the above findings. IMPRESSION: Chest: 1. No evidence of pulmonary embolus. 2. No acute intrathoracic process. 3. 4.1 cm ascending thoracic aortic aneurysm. Recommend annual imaging followup by CTA or MRA. This recommendation follows 2010 ACCF/AHA/AATS/ACR/ASA/SCA/SCAI/SIR/STS/SVM Guidelines for the Diagnosis and Management of Patients with Thoracic Aortic Disease. Circulation. 2010; 121: S063-K160. Aortic aneurysm NOS (ICD10-I71.9) Abdomen/pelvis: 1. New and enlarging masses throughout the liver, consistent with progressive metastatic disease or worsening primary liver malignancy since prior exam. 2. Mass effect on the proximal duodenum by the enlarging right lobe liver mass described above, with marked distension of the stomach. Decompression of the stomach with enteric catheter may be useful. 3. 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 4. Solid mass upper pole right kidney suspicious for renal cell carcinoma. Numerous other hypodensities throughout the liver parenchyma are too small to characterize. 5. Sacral decubitus ulcer, with evidence of underlying chronic osteomyelitis. 6. Large amount of retained stool within the distal colon consistent with constipation, with stool ball in the rectal vault concerning for  fecal impaction. No bowel obstruction. 7. Bilateral nonobstructing renal calculi. 8.  Aortic Atherosclerosis (ICD10-I70.0). 9. Chronic bladder wall thickening, trabeculations, and diverticula consistent with chronic bladder outlet obstruction. Electronically Signed   By: Sharlet Salina M.D.   On: 05/10/2023 18:12    Procedures Procedures   Medications Ordered in ED Medications  potassium chloride 10 mEq in 100 mL IVPB (0 mEq Intravenous Paused 05/10/23 1931)  acetaminophen (TYLENOL) tablet 1,000 mg (1,000 mg Oral Not Given 05/10/23 1849)  insulin aspart (novoLOG) injection 0-9 Units (has no administration in time range)  cefTRIAXone (ROCEPHIN) 1 g in sodium chloride 0.9 % 100 mL IVPB (has no administration in time range)  sodium chloride 0.9 % bolus 1,000 mL (0 mLs Intravenous Stopped 05/10/23 1721)  ondansetron (ZOFRAN) injection 4 mg (4 mg Intravenous Given 05/10/23 1553)  potassium chloride SA (KLOR-CON M) CR tablet 40 mEq (40 mEq Oral Given 05/10/23 1628)  iohexol (OMNIPAQUE) 350 MG/ML injection 100 mL (100 mLs Intravenous Contrast Given 05/10/23 1729)  pantoprazole (PROTONIX) injection 40 mg (40 mg Intravenous Given 05/10/23 1821)  cefTRIAXone (ROCEPHIN) 1 g in  sodium chloride 0.9 % 100 mL IVPB (1 g Intravenous New Bag/Given 05/10/23 1926)  sodium phosphate (FLEET) 7-19 GM/118ML enema 1 enema (1 enema Rectal Given 05/10/23 1932)    ED Course/ Medical Decision Making/ A&P                            Geneva (Revised) Score: 8, Geneva Score Interpretation: Moderate Risk Group: ~20-30% incidence of pulmonary embolism from several studies PERC Score: 2, PERC Score Interpretation: If any criteria are positive, the PERC rule cannot be used to rule out PE in this patient  Medical Decision Making:   Garreth M [name] is a 67 y.o. male who presented to the ED today with emesis and SOB detailed above.    External chart has been reviewed including 11/26/2022 [facility] notes. Patient placed on continuous vitals and  telemetry monitoring while in ED which was reviewed periodically.  Complete initial physical exam performed, notably the patient  was dehydrated on exam.    Reviewed and confirmed nursing documentation for past medical history, family history, social history.    Initial Assessment:   With the patient's presentation of emesis and intermittent abm pain, differential includes ileus, pancreatitis, gallstones, appendicitis, diverticulitis, gastroenteritis, UTI, nephrolithiasis. Given tachycardia, SOB, and chronic immobility 2/2 paraplegia, PE is considered.   This is most consistent with an acute complicated illness  Initial Plan:  1L NS bolus for dehydration.  Zofran 4mg  IV for nausea CTAP to workup intra-abdominal etiologies UA to workup UTI and nephrolithiasis Lipase to workup pancreatitis CMP and CBC Given high risk cardiac history, will also trend troponins. EKG to evaluate for cardiac pathology Objective evaluation as below reviewed   Initial Study Results:   Laboratory  - K low to 2.4 on chem8. Added Mg level. Started IV K x5. Start PO Kcl x1.  - UA c/f UTI. Start Rocephin.  EKG EKG was reviewed independently. C/w sinus tachycardia. Rhythm, axis, intervals all examined and without medically relevant abnormality. ST segments without concerns for elevations.    Radiology:  All images reviewed independently. Agree with radiology report at this time.   CT ABDOMEN PELVIS W CONTRAST  Result Date: 05/10/2023 CLINICAL DATA:  Abdominal pain, nausea and vomiting, diarrhea for 2 days, abdominal distension EXAM: CT ANGIOGRAPHY CHEST CT ABDOMEN AND PELVIS WITH CONTRAST TECHNIQUE: Multidetector CT imaging of the chest was performed using the standard protocol during bolus administration of intravenous contrast. Multiplanar CT image reconstructions and MIPs were obtained to evaluate the vascular anatomy. Multidetector CT imaging of the abdomen and pelvis was performed using the standard  protocol during bolus administration of intravenous contrast. RADIATION DOSE REDUCTION: This exam was performed according to the departmental dose-optimization program which includes automated exposure control, adjustment of the mA and/or kV according to patient size and/or use of iterative reconstruction technique. CONTRAST:  OMNIPAQUE IOHEXOL 350 MG/ML SOLN COMPARISON:  11/26/2021 FINDINGS: CTA CHEST FINDINGS Cardiovascular: This is a technically adequate evaluation of the pulmonary vasculature. No filling defects or pulmonary emboli. Prominent left ventricular dilatation again noted. No pericardial effusion. Ascending thoracic aortic aneurysm measuring up to 4.1 cm. No evidence of dissection. Atherosclerosis of the aorta and coronary vasculature. Mediastinum/Nodes: No enlarged mediastinal, hilar, or axillary lymph nodes. Thyroid gland, trachea, and esophagus demonstrate no significant findings. Lungs/Pleura: No acute airspace disease, effusion, or pneumothorax. Central airways are patent. Musculoskeletal: No acute or destructive bony abnormalities. Reconstructed images demonstrate no additional findings. Review of the MIP  images confirms the above findings. CT ABDOMEN and PELVIS FINDINGS Hepatobiliary: Since the prior exam, numerous large hypodense masses have replace the majority of the liver parenchyma, consistent with progressive primary malignancy or worsening metastatic disease. Largest mass occupying the majority of the right lobe liver image 45/2 measures 22.4 x 16.6 cm. Gallbladder is unremarkable. No biliary duct dilation. Pancreas: Unremarkable. No pancreatic ductal dilatation or surrounding inflammatory changes. Spleen: Normal in size without focal abnormality. Adrenals/Urinary Tract: There is a large soft tissue mass in the left suprarenal fossa, which may be associated with the lateral limb of the left adrenal gland, measuring up to 7.2 x 4.8 cm reference image 20/2, consistent with metastatic  disease. The right adrenal appears unremarkable. There is a solid mass within the upper pole right kidney measuring 3.1 x 2.7 cm, concerning for renal cell carcinoma. This is not changed significantly in size since prior study. There are numerous other hypodensity seen throughout the bilateral renal parenchyma, most too small to characterize accurately. There are bilateral nonobstructing renal calculi, measuring up to 9 mm on the right and 11 mm on the left. No obstructive uropathy within either kidney. Chronic bladder wall thickening, trabeculations, and multiple diverticula consistent with chronic bladder outlet obstruction. Stomach/Bowel: There is a large amount of stool within the rectosigmoid colon, consistent with constipation. A large amount of stool in the rectal vault may reflect fecal impaction. No evidence of bowel obstruction or ileus. There is marked distension of the stomach. This may be due to extrinsic compression upon the proximal duodenum by the enlarging hepatic masses described above. Decompression with enteric catheter may be useful. Vascular/Lymphatic: Aortic atherosclerosis. No discrete adenopathy identified within the abdomen or pelvis. Reproductive: The prostate is not enlarged. Other: There is no free fluid or free intraperitoneal gas. No abdominal wall hernia. Musculoskeletal: Progressive decubitus ulceration overlying the sacrum and coccyx. Underlying bony erosion and sclerosis consistent with osteomyelitis, likely chronic. There are no acute displaced fractures. Severe degenerative changes of the lumbar spine and bilateral hips. Reconstructed images demonstrate no additional findings. Review of the MIP images confirms the above findings. IMPRESSION: Chest: 1. No evidence of pulmonary embolus. 2. No acute intrathoracic process. 3. 4.1 cm ascending thoracic aortic aneurysm. Recommend annual imaging followup by CTA or MRA. This recommendation follows 2010  ACCF/AHA/AATS/ACR/ASA/SCA/SCAI/SIR/STS/SVM Guidelines for the Diagnosis and Management of Patients with Thoracic Aortic Disease. Circulation. 2010; 121: Y865-H846. Aortic aneurysm NOS (ICD10-I71.9) Abdomen/pelvis: 1. New and enlarging masses throughout the liver, consistent with progressive metastatic disease or worsening primary liver malignancy since prior exam. 2. Mass effect on the proximal duodenum by the enlarging right lobe liver mass described above, with marked distension of the stomach. Decompression of the stomach with enteric catheter may be useful. 3. 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 4. Solid mass upper pole right kidney suspicious for renal cell carcinoma. Numerous other hypodensities throughout the liver parenchyma are too small to characterize. 5. Sacral decubitus ulcer, with evidence of underlying chronic osteomyelitis. 6. Large amount of retained stool within the distal colon consistent with constipation, with stool ball in the rectal vault concerning for fecal impaction. No bowel obstruction. 7. Bilateral nonobstructing renal calculi. 8.  Aortic Atherosclerosis (ICD10-I70.0). 9. Chronic bladder wall thickening, trabeculations, and diverticula consistent with chronic bladder outlet obstruction. Electronically Signed   By: Sharlet Salina M.D.   On: 05/10/2023 18:12   CT Angio Chest PE W and/or Wo Contrast  Result Date: 05/10/2023 CLINICAL DATA:  Abdominal pain, nausea and vomiting, diarrhea for 2 days, abdominal distension EXAM: CT ANGIOGRAPHY CHEST CT ABDOMEN AND PELVIS WITH CONTRAST TECHNIQUE: Multidetector CT imaging of the chest was performed using the standard protocol during bolus administration of intravenous contrast. Multiplanar CT image reconstructions and MIPs were obtained to evaluate the vascular anatomy. Multidetector CT imaging of the abdomen and pelvis was performed using the standard protocol during bolus administration  of intravenous contrast. RADIATION DOSE REDUCTION: This exam was performed according to the departmental dose-optimization program which includes automated exposure control, adjustment of the mA and/or kV according to patient size and/or use of iterative reconstruction technique. CONTRAST:  OMNIPAQUE IOHEXOL 350 MG/ML SOLN COMPARISON:  11/26/2021 FINDINGS: CTA CHEST FINDINGS Cardiovascular: This is a technically adequate evaluation of the pulmonary vasculature. No filling defects or pulmonary emboli. Prominent left ventricular dilatation again noted. No pericardial effusion. Ascending thoracic aortic aneurysm measuring up to 4.1 cm. No evidence of dissection. Atherosclerosis of the aorta and coronary vasculature. Mediastinum/Nodes: No enlarged mediastinal, hilar, or axillary lymph nodes. Thyroid gland, trachea, and esophagus demonstrate no significant findings. Lungs/Pleura: No acute airspace disease, effusion, or pneumothorax. Central airways are patent. Musculoskeletal: No acute or destructive bony abnormalities. Reconstructed images demonstrate no additional findings. Review of the MIP images confirms the above findings. CT ABDOMEN and PELVIS FINDINGS Hepatobiliary: Since the prior exam, numerous large hypodense masses have replace the majority of the liver parenchyma, consistent with progressive primary malignancy or worsening metastatic disease. Largest mass occupying the majority of the right lobe liver image 45/2 measures 22.4 x 16.6 cm. Gallbladder is unremarkable. No biliary duct dilation. Pancreas: Unremarkable. No pancreatic ductal dilatation or surrounding inflammatory changes. Spleen: Normal in size without focal abnormality. Adrenals/Urinary Tract: There is a large soft tissue mass in the left suprarenal fossa, which may be associated with the lateral limb of the left adrenal gland, measuring up to 7.2 x 4.8 cm reference image 20/2, consistent with metastatic disease. The right adrenal appears  unremarkable. There is a solid mass within the upper pole right kidney measuring 3.1 x 2.7 cm, concerning for renal cell carcinoma. This is not changed significantly in size since prior study. There are numerous other hypodensity seen throughout the bilateral renal parenchyma, most too small to characterize accurately. There are bilateral nonobstructing renal calculi, measuring up to 9 mm on the right and 11 mm on the left. No obstructive uropathy within either kidney. Chronic bladder wall thickening, trabeculations, and multiple diverticula consistent with chronic bladder outlet obstruction. Stomach/Bowel: There is a large amount of stool within the rectosigmoid colon, consistent with constipation. A large amount of stool in the rectal vault may reflect fecal impaction. No evidence of bowel obstruction or ileus. There is marked distension of the stomach. This may be due to extrinsic compression upon the proximal duodenum by the enlarging hepatic masses described above. Decompression with enteric catheter may be useful. Vascular/Lymphatic: Aortic atherosclerosis. No discrete adenopathy identified within the abdomen or pelvis. Reproductive: The prostate is not enlarged. Other: There is no free fluid or free intraperitoneal gas. No abdominal wall hernia. Musculoskeletal: Progressive decubitus ulceration overlying the sacrum and coccyx. Underlying bony erosion and sclerosis consistent with osteomyelitis, likely chronic. There are no acute displaced fractures. Severe degenerative changes of the lumbar spine and bilateral hips. Reconstructed images demonstrate no additional findings. Review of the MIP images confirms the above findings. IMPRESSION: Chest: 1. No evidence of pulmonary embolus. 2. No acute intrathoracic process. 3. 4.1 cm ascending thoracic aortic aneurysm. Recommend annual imaging  followup by CTA or MRA. This recommendation follows 2010 ACCF/AHA/AATS/ACR/ASA/SCA/SCAI/SIR/STS/SVM Guidelines for the  Diagnosis and Management of Patients with Thoracic Aortic Disease. Circulation. 2010; 121: [id number]. Aortic aneurysm NOS (ICD10-I71.9) Abdomen/pelvis: 1. New and enlarging masses throughout the liver, consistent with progressive metastatic disease or worsening primary liver malignancy since prior exam. 2. Mass effect on the proximal duodenum by the enlarging right lobe liver mass described above, with marked distension of the stomach. Decompression of the stomach with enteric catheter may be useful. 3. 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 4. Solid mass upper pole right kidney suspicious for renal cell carcinoma. Numerous other hypodensities throughout the liver parenchyma are too small to characterize. 5. Sacral decubitus ulcer, with evidence of underlying chronic osteomyelitis. 6. Large amount of retained stool within the distal colon consistent with constipation, with stool ball in the rectal vault concerning for fecal impaction. No bowel obstruction. 7. Bilateral nonobstructing renal calculi. 8.  Aortic Atherosclerosis (ICD10-I70.0). 9. Chronic bladder wall thickening, trabeculations, and diverticula consistent with chronic bladder outlet obstruction. Electronically Signed   By: Sharlet Salina M.D.   On: 05/10/2023 18:12      Consults: Case discussed with Oncology.   Reassessment and Plan:   - Rocephin for UTI - Fleet enema for stool burden on CTAP - Hospitalist consulted for admission - Oncologist consulted for new liver, adrenal, and renal mass c/f metastatic disease.  - NG tube recommended for gastric decompression but pt declines it at this time.     Final Clinical Impression(s) / ED Diagnoses Final diagnoses:  Malignant neoplasm of liver, unspecified liver malignancy type (HCC)  Hypokalemia  Fecal impaction (HCC)  Pressure injury of sacral region, stage 4 (HCC)  Nausea and vomiting, unspecified vomiting type    Rx / DC Orders ED  Discharge Orders     None         Lincoln Brigham, MD 05/10/23 2016    Lincoln Brigham, MD 05/10/23 2016    Charlynne Pander, MD 05/10/23 [id number]

## 2023-05-10 NOTE — ED Triage Notes (Signed)
Patient BIB EMS. Patient reports abdominal pain, nausea, vomiting, and diarrhea x 2 days. Patient states emesis is dark brown in color. Abd is distended.  Patient is bed bound at baseline.

## 2023-05-10 NOTE — H&P (Addendum)
History and Physical    Patient: Jeffrey [name] ZDG:[id number] DOB: 1956/03/17 DOA: 05/10/2023 DOS: the patient was seen and examined on 05/10/2023 PCP: Verdell Face, DO  Patient coming from: Home - lives alone, bedbound. WC when able    Chief Complaint: emesis and abdominal pain   HPI: Jeffrey [name] is a 67 y.o. male with medical history significant of paraplegia secondary to spinal stenosis, s/p right AKA, T2DM, HTN, PAF, HLD, anemia, ischemic CM, CAD, chronic sacral pressure wound who presented to ED with 3-4 days of emesis. He states about 4 days ago every time he would drink he would throw it right back up and it would be brown in color. He would have abdominal pain after he would vomit. He also has some diarrhea, but takes a stool softener. He also reports weakness for over a year. He has been losing weight, doesn't think any blood in his stool.   History of non compliance. Was admitted for AKA in 06/2022 after NSTEMI in 2/23, but never followed up with his cardiologist and unclear PCP; however, he is getting some medication filled and states he sees [facility].   Denies any fever/chills, vision changes/headaches, chest pain or palpitations, shortness of breath or cough, abdominal pain, leg swelling. Unsure of urinary symptoms, can not feel .    He does not smoke or drink alcohol.   ER Course:  vitals: afebrile, bp: 155/86, HR: 109, RR: 16, oxygen: 97% Pertinent labs: wbc: 13.5, hgb: 7.6 (7-8), potassium: 2.4, BUN: 27, CO2: 37,  CT abdomen/pelvis: New and enlarging masses throughout the liver, consistent with progressive metastatic disease or worsening primary liver malignancy since prior exam. 2. Mass effect on the proximal duodenum by the enlarging right lobe liver mass described above, with marked distension of the stomach. Decompression of the stomach with enteric catheter may be useful. 3. Large soft tissue masses within the left suprarenal region, likely due to adrenal  metastases. 4. Solid mass upper pole right kidney suspicious for renal cell carcinoma. Numerous other hypodensities throughout the liver parenchyma are too small to characterize. 5. Sacral decubitus ulcer, with evidence of underlying chronic osteomyelitis. 6. Large amount of retained stool within the distal colon consistent with constipation, with stool ball in the rectal vault concerning for fecal impaction. No bowel obstruction. 7. Bilateral nonobstructing renal calculi. 8.  Aortic Atherosclerosis (ICD10-I70.0). 9. Chronic bladder wall thickening, trabeculations, and diverticula consistent with chronic bladder outlet obstruction. CTA chest: no PE. 4.1 cm ascending AAA.  In ED: given rocephin, zofran, protonix, potassium, 1L IVF bolus and fleet enema. NGT ordered, patient refused. TRH asked to admit.   Review of Systems: As mentioned in the history of present illness. All other systems reviewed and are negative. Past Medical History:  Diagnosis Date   Diabetes mellitus    Hypertension    Paralysis (HCC)    Spinal stenosis    Past Surgical History:  Procedure Laterality Date   AMPUTATION Right 06/27/2022   Procedure: RIGHT ABOVE KNEE AMPUTATION;  Surgeon: Nadara Mustard, MD;  Location: [facility] OR;  Service: Orthopedics;  Laterality: Right;   BACK SURGERY     Social History:  reports that he has never smoked. He has never used smokeless tobacco. He reports that he does not drink alcohol and does not use drugs.  No Known Allergies  History reviewed. No pertinent family history.  Prior to Admission medications   Medication Sig Start Date End Date Taking? Authorizing Provider  acetaminophen (TYLENOL) 325 MG tablet  Take 1-2 tablets (325-650 mg total) by mouth every 6 (six) hours as needed for mild pain (pain score 1-3 or temp > 100.5). 07/01/22   Azucena Fallen, MD  amLODipine (NORVASC) 10 MG tablet Take 10 mg by mouth daily.    [provider]  aspirin EC 81 MG tablet Take  1 tablet (81 mg total) by mouth daily. Swallow whole. 07/01/22   Azucena Fallen, MD  clopidogrel (PLAVIX) 75 MG tablet Take 1 tablet (75 mg total) by mouth daily. 07/01/22   Azucena Fallen, MD  collagenase (SANTYL) 250 UNIT/GM ointment Apply topically daily. 07/01/22   Azucena Fallen, MD  docusate sodium (COLACE) 100 MG capsule Take 1 capsule (100 mg total) by mouth daily. 07/01/22   Azucena Fallen, MD  FARXIGA 10 MG TABS tablet Take 10 mg by mouth daily. 04/24/22   [provider]  furosemide (LASIX) 20 MG tablet Take 20 mg by mouth daily. 06/09/14   [provider]  lactose free nutrition (BOOST) LIQD Take 237 mLs by mouth in the morning and at bedtime.    [provider]  lisinopril (ZESTRIL) 10 MG tablet Take 10 mg by mouth 2 (two) times daily. 05/29/22   [provider]  metoprolol (LOPRESSOR) 50 MG tablet Take 50 mg by mouth 2 (two) times daily.    [provider]  nitroGLYCERIN (NITROSTAT) 0.4 MG SL tablet Place 0.4 mg under the tongue every 5 (five) minutes as needed for chest pain. 06/01/22   [provider]  oxyCODONE-acetaminophen (PERCOCET) 7.5-325 MG per tablet Take 1 tablet by mouth every 6 (six) hours as needed for moderate pain. 08/20/14   [provider]  pantoprazole (PROTONIX) 40 MG tablet Take 1 tablet (40 mg total) by mouth daily. 07/01/22   Azucena Fallen, MD  polyethylene glycol (MIRALAX / GLYCOLAX) 17 g packet Take 17 g by mouth daily as needed for mild constipation. 07/01/22   Azucena Fallen, MD  potassium chloride SA (KLOR-CON M) 20 MEQ tablet Take 20 mEq by mouth daily. 04/24/22   [provider]  rosuvastatin (CRESTOR) 10 MG tablet Take 10 mg by mouth 2 (two) times a week.    [provider]  SSD 1 % cream Apply 1 Application topically daily. 04/24/22   [provider]    Physical Exam: Vitals:   05/10/23 1700 05/10/23 1900 05/10/23 1930 05/10/23 2010  BP:  (!) 155/86  (!) 156/88   Pulse: (!) 109 (!) 106 (!) 110   Resp: 16 18 (!) 24   Temp:    98.2 F (36.8 C)  TempSrc:    Oral  SpO2: 97% 100% 100%   Weight:      Height:       General:  Appears calm and comfortable and is in NAD Eyes:  PERRL, EOMI, normal lids, iris ENT:  grossly normal hearing, lips & tongue, mmm; poor dentition Neck:  no LAD, masses or thyromegaly; no carotid bruits Cardiovascular:  RRR, no m/r/g. No LE edema. Right AKA with wasting  Respiratory:   CTA bilaterally with no wheezes/rales/rhonchi.  Normal respiratory effort. Abdomen:  soft, NT, ND, hepatomegaly NABS Back:   normal alignment, no CVAT Skin:  no rash or induration seen on limited exam. Large sacral ulcer stage 3-4. Pink, no erythema  Musculoskeletal:  grossly normal tone BUE. Right AKA, left with weakness/muscle wasting  Lower extremity:  No LE edema. R AKA.   Limited foot exam with no ulcerations.  2+ distal pulses. Psychiatric:  grossly normal mood and affect, speech fluent and appropriate, AOx3 Neurologic:  CN 2-12 grossly intact, moves all extremities in coordinated fashion, sensation intact   Radiological Exams on Admission: Independently reviewed - see discussion in A/P where applicable  CT ABDOMEN PELVIS W CONTRAST  Result Date: 05/10/2023 CLINICAL DATA:  Abdominal pain, nausea and vomiting, diarrhea for 2 days, abdominal distension EXAM: CT ANGIOGRAPHY CHEST CT ABDOMEN AND PELVIS WITH CONTRAST TECHNIQUE: Multidetector CT imaging of the chest was performed using the standard protocol during bolus administration of intravenous contrast. Multiplanar CT image reconstructions and MIPs were obtained to evaluate the vascular anatomy. Multidetector CT imaging of the abdomen and pelvis was performed using the standard protocol during bolus administration of intravenous contrast. RADIATION DOSE REDUCTION: This exam was performed according to the departmental dose-optimization program which includes automated  exposure control, adjustment of the mA and/or kV according to patient size and/or use of iterative reconstruction technique. CONTRAST:  OMNIPAQUE IOHEXOL 350 MG/ML SOLN COMPARISON:  11/26/2021 FINDINGS: CTA CHEST FINDINGS Cardiovascular: This is a technically adequate evaluation of the pulmonary vasculature. No filling defects or pulmonary emboli. Prominent left ventricular dilatation again noted. No pericardial effusion. Ascending thoracic aortic aneurysm measuring up to 4.1 cm. No evidence of dissection. Atherosclerosis of the aorta and coronary vasculature. Mediastinum/Nodes: No enlarged mediastinal, hilar, or axillary lymph nodes. Thyroid gland, trachea, and esophagus demonstrate no significant findings. Lungs/Pleura: No acute airspace disease, effusion, or pneumothorax. Central airways are patent. Musculoskeletal: No acute or destructive bony abnormalities. Reconstructed images demonstrate no additional findings. Review of the MIP images confirms the above findings. CT ABDOMEN and PELVIS FINDINGS Hepatobiliary: Since the prior exam, numerous large hypodense masses have replace the majority of the liver parenchyma, consistent with progressive primary malignancy or worsening metastatic disease. Largest mass occupying the majority of the right lobe liver image 45/2 measures 22.4 x 16.6 cm. Gallbladder is unremarkable. No biliary duct dilation. Pancreas: Unremarkable. No pancreatic ductal dilatation or surrounding inflammatory changes. Spleen: Normal in size without focal abnormality. Adrenals/Urinary Tract: There is a large soft tissue mass in the left suprarenal fossa, which may be associated with the lateral limb of the left adrenal gland, measuring up to 7.2 x 4.8 cm reference image 20/2, consistent with metastatic disease. The right adrenal appears unremarkable. There is a solid mass within the upper pole right kidney measuring 3.1 x 2.7 cm, concerning for renal cell carcinoma. This is not changed  significantly in size since prior study. There are numerous other hypodensity seen throughout the bilateral renal parenchyma, most too small to characterize accurately. There are bilateral nonobstructing renal calculi, measuring up to 9 mm on the right and 11 mm on the left. No obstructive uropathy within either kidney. Chronic bladder wall thickening, trabeculations, and multiple diverticula consistent with chronic bladder outlet obstruction. Stomach/Bowel: There is a large amount of stool within the rectosigmoid colon, consistent with constipation. A large amount of stool in the rectal vault may reflect fecal impaction. No evidence of bowel obstruction or ileus. There is marked distension of the stomach. This may be due to extrinsic compression upon the proximal duodenum by the enlarging hepatic masses described above. Decompression with enteric catheter may be useful. Vascular/Lymphatic: Aortic atherosclerosis. No discrete adenopathy identified within the abdomen or pelvis. Reproductive: The prostate is not enlarged. Other: There is no free fluid or free intraperitoneal gas. No abdominal wall hernia. Musculoskeletal: Progressive decubitus ulceration overlying the sacrum and coccyx. Underlying bony erosion and sclerosis consistent with  osteomyelitis, likely chronic. There are no acute displaced fractures. Severe degenerative changes of the lumbar spine and bilateral hips. Reconstructed images demonstrate no additional findings. Review of the MIP images confirms the above findings. IMPRESSION: Chest: 1. No evidence of pulmonary embolus. 2. No acute intrathoracic process. 3. 4.1 cm ascending thoracic aortic aneurysm. Recommend annual imaging followup by CTA or MRA. This recommendation follows 2010 ACCF/AHA/AATS/ACR/ASA/SCA/SCAI/SIR/STS/SVM Guidelines for the Diagnosis and Management of Patients with Thoracic Aortic Disease. Circulation. 2010; 121: Y782-N562. Aortic aneurysm NOS (ICD10-I71.9) Abdomen/pelvis: 1. New  and enlarging masses throughout the liver, consistent with progressive metastatic disease or worsening primary liver malignancy since prior exam. 2. Mass effect on the proximal duodenum by the enlarging right lobe liver mass described above, with marked distension of the stomach. Decompression of the stomach with enteric catheter may be useful. 3. 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 4. Solid mass upper pole right kidney suspicious for renal cell carcinoma. Numerous other hypodensities throughout the liver parenchyma are too small to characterize. 5. Sacral decubitus ulcer, with evidence of underlying chronic osteomyelitis. 6. Large amount of retained stool within the distal colon consistent with constipation, with stool ball in the rectal vault concerning for fecal impaction. No bowel obstruction. 7. Bilateral nonobstructing renal calculi. 8.  Aortic Atherosclerosis (ICD10-I70.0). 9. Chronic bladder wall thickening, trabeculations, and diverticula consistent with chronic bladder outlet obstruction. Electronically Signed   By: Sharlet Salina M.D.   On: 05/10/2023 18:12   CT Angio Chest PE W and/or Wo Contrast  Result Date: 05/10/2023 CLINICAL DATA:  Abdominal pain, nausea and vomiting, diarrhea for 2 days, abdominal distension EXAM: CT ANGIOGRAPHY CHEST CT ABDOMEN AND PELVIS WITH CONTRAST TECHNIQUE: Multidetector CT imaging of the chest was performed using the standard protocol during bolus administration of intravenous contrast. Multiplanar CT image reconstructions and MIPs were obtained to evaluate the vascular anatomy. Multidetector CT imaging of the abdomen and pelvis was performed using the standard protocol during bolus administration of intravenous contrast. RADIATION DOSE REDUCTION: This exam was performed according to the departmental dose-optimization program which includes automated exposure control, adjustment of the mA and/or kV according to  patient size and/or use of iterative reconstruction technique. CONTRAST:  OMNIPAQUE IOHEXOL 350 MG/ML SOLN COMPARISON:  11/26/2021 FINDINGS: CTA CHEST FINDINGS Cardiovascular: This is a technically adequate evaluation of the pulmonary vasculature. No filling defects or pulmonary emboli. Prominent left ventricular dilatation again noted. No pericardial effusion. Ascending thoracic aortic aneurysm measuring up to 4.1 cm. No evidence of dissection. Atherosclerosis of the aorta and coronary vasculature. Mediastinum/Nodes: No enlarged mediastinal, hilar, or axillary lymph nodes. Thyroid gland, trachea, and esophagus demonstrate no significant findings. Lungs/Pleura: No acute airspace disease, effusion, or pneumothorax. Central airways are patent. Musculoskeletal: No acute or destructive bony abnormalities. Reconstructed images demonstrate no additional findings. Review of the MIP images confirms the above findings. CT ABDOMEN and PELVIS FINDINGS Hepatobiliary: Since the prior exam, numerous large hypodense masses have replace the majority of the liver parenchyma, consistent with progressive primary malignancy or worsening metastatic disease. Largest mass occupying the majority of the right lobe liver image 45/2 measures 22.4 x 16.6 cm. Gallbladder is unremarkable. No biliary duct dilation. Pancreas: Unremarkable. No pancreatic ductal dilatation or surrounding inflammatory changes. Spleen: Normal in size without focal abnormality. Adrenals/Urinary Tract: There is a large soft tissue mass in the left suprarenal fossa, which may be associated with the lateral limb of the left adrenal gland, measuring up to 7.2 x 4.8 cm  reference image 20/2, consistent with metastatic disease. The right adrenal appears unremarkable. There is a solid mass within the upper pole right kidney measuring 3.1 x 2.7 cm, concerning for renal cell carcinoma. This is not changed significantly in size since prior study. There are numerous other  hypodensity seen throughout the bilateral renal parenchyma, most too small to characterize accurately. There are bilateral nonobstructing renal calculi, measuring up to 9 mm on the right and 11 mm on the left. No obstructive uropathy within either kidney. Chronic bladder wall thickening, trabeculations, and multiple diverticula consistent with chronic bladder outlet obstruction. Stomach/Bowel: There is a large amount of stool within the rectosigmoid colon, consistent with constipation. A large amount of stool in the rectal vault may reflect fecal impaction. No evidence of bowel obstruction or ileus. There is marked distension of the stomach. This may be due to extrinsic compression upon the proximal duodenum by the enlarging hepatic masses described above. Decompression with enteric catheter may be useful. Vascular/Lymphatic: Aortic atherosclerosis. No discrete adenopathy identified within the abdomen or pelvis. Reproductive: The prostate is not enlarged. Other: There is no free fluid or free intraperitoneal gas. No abdominal wall hernia. Musculoskeletal: Progressive decubitus ulceration overlying the sacrum and coccyx. Underlying bony erosion and sclerosis consistent with osteomyelitis, likely chronic. There are no acute displaced fractures. Severe degenerative changes of the lumbar spine and bilateral hips. Reconstructed images demonstrate no additional findings. Review of the MIP images confirms the above findings. IMPRESSION: Chest: 1. No evidence of pulmonary embolus. 2. No acute intrathoracic process. 3. 4.1 cm ascending thoracic aortic aneurysm. Recommend annual imaging followup by CTA or MRA. This recommendation follows 2010 ACCF/AHA/AATS/ACR/ASA/SCA/SCAI/SIR/STS/SVM Guidelines for the Diagnosis and Management of Patients with Thoracic Aortic Disease. Circulation. 2010; 121: Z610-R604. Aortic aneurysm NOS (ICD10-I71.9) Abdomen/pelvis: 1. New and enlarging masses throughout the liver, consistent with  progressive metastatic disease or worsening primary liver malignancy since prior exam. 2. Mass effect on the proximal duodenum by the enlarging right lobe liver mass described above, with marked distension of the stomach. Decompression of the stomach with enteric catheter may be useful. 3. 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 4. Solid mass upper pole right kidney suspicious for renal cell carcinoma. Numerous other hypodensities throughout the liver parenchyma are too small to characterize. 5. Sacral decubitus ulcer, with evidence of underlying chronic osteomyelitis. 6. Large amount of retained stool within the distal colon consistent with constipation, with stool ball in the rectal vault concerning for fecal impaction. No bowel obstruction. 7. Bilateral nonobstructing renal calculi. 8.  Aortic Atherosclerosis (ICD10-I70.0). 9. Chronic bladder wall thickening, trabeculations, and diverticula consistent with chronic bladder outlet obstruction. Electronically Signed   By: Sharlet Salina M.D.   On: 05/10/2023 18:12    EKG: Independently reviewed.  Sinus tachycardia with rate 133; nonspecific ST changes with no evidence of acute ischemia PVC, anterior q waves    Labs on Admission: I have personally reviewed the available labs and imaging studies at the time of the admission.  Pertinent labs:   wbc: 13.5,  hgb: 7.6 (7-8),  potassium: 2.4,  BUN: 27,  CO2: 37,   Assessment and Plan: Principal Problem:   Intractable nausea and vomiting Active Problems:   Metastatic disease (HCC)   chronic Osteomyelitis (HCC)   Hypokalemia   Chronic anemia   Paroxysmal atrial fibrillation (HCC)   SIRS (systemic inflammatory response syndrome) (HCC)   Ischemic cardiomyopathy   CAD S/P percutaneous coronary angioplasty   Essential hypertension  Type 2 diabetes mellitus (HCC)   Hyperlipidemia   S/P AKA (above knee amputation), right (HCC)   Ascending aortic  aneurysm (HCC)   Spinal stenosis with paraplegia    Assessment and Plan: * Intractable nausea and vomiting 67 year old with 3-4 day history of emesis and stomach pain found to have metastatic disease with mass effect of proximal duodenum by enlarging right lobe liver mass with marked stomach distention  -obs to tele  -NG tube ordered, but patient refused -has been tolerating liquids with no vomiting -PPI q 12 hours  -continue anti-emetics. Will continue liquid diet  -goal will be plan of care for his newly diagnosed metastatic disease. With extensive disease, poor overall status will have palliative care consulted and f/u on oncology recommendations   Metastatic disease (HCC) New findings of solid mass upper pole of right kidney suspicious for RCC with liver masses, adrenal masses.  Mass effect of proximal duodenum by enlarging right lobe liver mass with marked stomach distention.  NG tube ordered, but patient refused. He has had no more episodes of vomiting Oncology has been consulted   Hypokalemia Potassium of 2.4 in setting of three day history of emesis On telemetry  Magnesium wnl Repleted in ED Trend   chronic Osteomyelitis (HCC) Sacral decubitus ulcer, with evidence of underlying chronic osteomyelitis. Wound care consult  Likely stage 3-4 ulcer Inflammatory markers checked, but likely elevated with other disease processes On rocephin, ID consult tomorrow for oral abx/suppressive medication   Chronic anemia Baseline hgb appears to be 7-8 over last 6 months. With cardiac history would transfuse for hgb <8.0 Type and screen Repeat CBC and transfuse if hgb <8, consents to transfusion  Check iron studies and fecal occult  Serial CBC    SIRS (systemic inflammatory response syndrome) (HCC) SIRS criteria with leukocytosis and tachycardia Possible Uti and chronic OM of his sacral ulcer Continue rocephin  No fever/chills  Urine culture pending BC pending  Check lactic  acid, but may be elevated with volume depletion from vomiting and poor PO intake  Given 1L IVF in ED. Will give judicial, time limited IVF with known ischemic CM    Paroxysmal atrial fibrillation [facility]) Per hospital d/c from NSTEMI in 2/23.  He is on metoprolol, never started on [facility]  In NSR/tachycardic Never has seen cardiology since NSTEMI in 11/2021   Ischemic cardiomyopathy Appears dry to euvolemic on exam  EF previously 25 to 30%  Echo in 06/2022 showed EF 35 to 40%.  Global hypokinesis noted.  Diastolic dysfunction also seen. He is supposed to be followed by Dr. Sedalia Muta but has had no cardiology f/u since NSTEMI in 11/2021 Unsure if taking lasix, not taking farxiga On metoprolol, states he is not taking his lisinopril Would avoid CCB   CAD S/P percutaneous coronary angioplasty February 2023 had NSTEMI per chart review  PCI of LAD and left main and with 3 stents to LAD .   Placed on dual antiplatelets with aspirin and Plavix at that time- no follow up since that time per previous discussion -unable to secure refills for his medication due to poor follow-up (only on aspirin) DAPT resumed on hospital d/c in 06/2022, but he is not on plavix and unknown how long he did DAPT No f/u with cardiology since his NSTEMI in hospital in 11/2021 Troponin wnl x 2, no chest pain, EKG with tachycardia  -not taking statin, on beta blocker and ASA   Essential hypertension Unsure what medication he is taking vs.  Not taking Will do lopressor 50mg  BID for now   Type 2 diabetes mellitus (HCC) Check A1C (4.9 in September of 2023)  Sensitive SSI and accuchecks qac/hs   Hyperlipidemia Has crestor in chart, but states he is not taking this   S/P AKA (above knee amputation), right (HCC) S/p right AKA on 06/27/22 secondary to bilateral lower extremity ulcers and osteomyelitis with bilateral feet involvement   Ascending aortic aneurysm (HCC) 4.1cm ascending thoracic aortic aneurysm.   Per radiology guidelines, recommend annual f/u with CTA or MRA   Spinal stenosis with paraplegia Continue home pain medication  Getting percocet from a physician in [facility] per PMP review    Advance Care Planning:   Code Status: Full Code   Consults: oncology, palliative care, SW   DVT Prophylaxis: SCD   Family Communication: none   Severity of Illness: The appropriate patient status for this patient is OBSERVATION. Observation status is judged to be reasonable and necessary in order to provide the required intensity of service to ensure the patient's safety. The patient's presenting symptoms, physical exam findings, and initial radiographic and laboratory data in the context of their medical condition is felt to place them at decreased risk for further clinical deterioration. Furthermore, it is anticipated that the patient will be medically stable for discharge from the hospital within 2 midnights of admission.   Author: Orland Mustard, MD 05/10/2023 10:43 PM  For on call review [email or web address].

## 2023-05-10 NOTE — Assessment & Plan Note (Addendum)
February 2023 had NSTEMI per chart review  PCI of LAD and left main and with 3 stents to LAD .   Placed on dual antiplatelets with aspirin and Plavix at that time- no follow up since that time per previous discussion -unable to secure refills for his medication due to poor follow-up (only on aspirin) DAPT resumed on hospital d/c in 06/2022, but he is not on plavix and unknown how long he did DAPT No f/u with cardiology since his NSTEMI in hospital in 11/2021 Troponin wnl x 2, no chest pain, EKG with tachycardia  -not taking statin, on beta blocker and ASA

## 2023-05-10 NOTE — ED Notes (Signed)
IV team at bedside at this time. 

## 2023-05-10 NOTE — Assessment & Plan Note (Addendum)
Has crestor in chart, but states he is not taking this

## 2023-05-10 NOTE — Assessment & Plan Note (Addendum)
Per hospital d/c from NSTEMI in 2/23.  He is on metoprolol, never started on [facility]  In NSR/tachycardic Never has seen cardiology since NSTEMI in 11/2021

## 2023-05-10 NOTE — Assessment & Plan Note (Signed)
Potassium of 2.4 in setting of three day history of emesis On telemetry  Magnesium wnl Repleted in ED Trend

## 2023-05-11 DIAGNOSIS — Z66 Do not resuscitate: Secondary | ICD-10-CM

## 2023-05-11 DIAGNOSIS — L89154 Pressure ulcer of sacral region, stage 4: Secondary | ICD-10-CM | POA: Diagnosis not present

## 2023-05-11 DIAGNOSIS — R6510 Systemic inflammatory response syndrome (SIRS) of non-infectious origin without acute organ dysfunction: Secondary | ICD-10-CM | POA: Diagnosis present

## 2023-05-11 DIAGNOSIS — E1151 Type 2 diabetes mellitus with diabetic peripheral angiopathy without gangrene: Secondary | ICD-10-CM | POA: Diagnosis present

## 2023-05-11 DIAGNOSIS — K5641 Fecal impaction: Secondary | ICD-10-CM | POA: Diagnosis present

## 2023-05-11 DIAGNOSIS — K311 Adult hypertrophic pyloric stenosis: Secondary | ICD-10-CM | POA: Diagnosis present

## 2023-05-11 DIAGNOSIS — G893 Neoplasm related pain (acute) (chronic): Secondary | ICD-10-CM

## 2023-05-11 DIAGNOSIS — R112 Nausea with vomiting, unspecified: Secondary | ICD-10-CM | POA: Diagnosis present

## 2023-05-11 DIAGNOSIS — C787 Secondary malignant neoplasm of liver and intrahepatic bile duct: Secondary | ICD-10-CM | POA: Diagnosis present

## 2023-05-11 DIAGNOSIS — C799 Secondary malignant neoplasm of unspecified site: Secondary | ICD-10-CM

## 2023-05-11 DIAGNOSIS — R1114 Bilious vomiting: Secondary | ICD-10-CM | POA: Diagnosis not present

## 2023-05-11 DIAGNOSIS — I110 Hypertensive heart disease with heart failure: Secondary | ICD-10-CM | POA: Diagnosis present

## 2023-05-11 DIAGNOSIS — C801 Malignant (primary) neoplasm, unspecified: Secondary | ICD-10-CM | POA: Diagnosis present

## 2023-05-11 DIAGNOSIS — E1169 Type 2 diabetes mellitus with other specified complication: Secondary | ICD-10-CM | POA: Diagnosis present

## 2023-05-11 DIAGNOSIS — I255 Ischemic cardiomyopathy: Secondary | ICD-10-CM

## 2023-05-11 DIAGNOSIS — C649 Malignant neoplasm of unspecified kidney, except renal pelvis: Secondary | ICD-10-CM | POA: Diagnosis present

## 2023-05-11 DIAGNOSIS — Z7189 Other specified counseling: Secondary | ICD-10-CM

## 2023-05-11 DIAGNOSIS — Z79899 Other long term (current) drug therapy: Secondary | ICD-10-CM

## 2023-05-11 DIAGNOSIS — G8220 Paraplegia, unspecified: Secondary | ICD-10-CM | POA: Diagnosis present

## 2023-05-11 DIAGNOSIS — M4628 Osteomyelitis of vertebra, sacral and sacrococcygeal region: Secondary | ICD-10-CM | POA: Diagnosis present

## 2023-05-11 DIAGNOSIS — E278 Other specified disorders of adrenal gland: Secondary | ICD-10-CM | POA: Diagnosis present

## 2023-05-11 DIAGNOSIS — I480 Paroxysmal atrial fibrillation: Secondary | ICD-10-CM | POA: Diagnosis present

## 2023-05-11 DIAGNOSIS — E860 Dehydration: Secondary | ICD-10-CM | POA: Diagnosis present

## 2023-05-11 DIAGNOSIS — Z515 Encounter for palliative care: Secondary | ICD-10-CM

## 2023-05-11 DIAGNOSIS — I5022 Chronic systolic (congestive) heart failure: Secondary | ICD-10-CM | POA: Diagnosis present

## 2023-05-11 DIAGNOSIS — R627 Adult failure to thrive: Secondary | ICD-10-CM | POA: Diagnosis present

## 2023-05-11 DIAGNOSIS — I7121 Aneurysm of the ascending aorta, without rupture: Secondary | ICD-10-CM | POA: Diagnosis present

## 2023-05-11 DIAGNOSIS — Z89511 Acquired absence of right leg below knee: Secondary | ICD-10-CM | POA: Diagnosis not present

## 2023-05-11 DIAGNOSIS — D509 Iron deficiency anemia, unspecified: Secondary | ICD-10-CM | POA: Diagnosis present

## 2023-05-11 DIAGNOSIS — R64 Cachexia: Secondary | ICD-10-CM | POA: Diagnosis present

## 2023-05-11 DIAGNOSIS — C229 Malignant neoplasm of liver, not specified as primary or secondary: Secondary | ICD-10-CM | POA: Diagnosis not present

## 2023-05-11 DIAGNOSIS — R160 Hepatomegaly, not elsewhere classified: Secondary | ICD-10-CM | POA: Diagnosis present

## 2023-05-11 LAB — CBG MONITORING, ED
Glucose-Capillary: 96 mg/dL (ref 70–99)
Glucose-Capillary: 88 mg/dL (ref 70–99)

## 2023-05-11 LAB — POTASSIUM: Potassium: 3.1 mmol/L — ABNORMAL LOW (ref 3.5–5.1)

## 2023-05-11 LAB — POC OCCULT BLOOD, ED: Fecal Occult Bld: POSITIVE — AB

## 2023-05-11 MED ORDER — DIPHENHYDRAMINE HCL 50 MG/ML IJ SOLN: 12.5000 mg | Freq: Four times a day (QID) | INTRAMUSCULAR | Status: DC | PRN

## 2023-05-11 MED ORDER — HYDROMORPHONE HCL 1 MG/ML IJ SOLN
0.5000 mg | INTRAMUSCULAR | Status: DC | PRN
  Administered 2023-05-11: 0.5 mg via INTRAVENOUS
  Filled 2023-05-11: qty 0.5

## 2023-05-11 MED ORDER — MEDIHONEY WOUND/BURN DRESSING EX PSTE
1.0000 | PASTE | Freq: Every day | CUTANEOUS | Status: DC
  Administered 2023-05-12 – 2023-05-13 (×2): 1 via TOPICAL
  Filled 2023-05-11: qty 44

## 2023-05-11 MED ORDER — POTASSIUM CHLORIDE IN NACL 20-0.9 MEQ/L-% IV SOLN
INTRAVENOUS | Status: DC
  Filled 2023-05-11 (×3): qty 1000

## 2023-05-11 MED ORDER — HYDROMORPHONE 1 MG/ML IV SOLN
INTRAVENOUS | Status: DC
  Administered 2023-05-11: 0.5 mg via INTRAVENOUS
  Administered 2023-05-11: 30 mg via INTRAVENOUS
  Administered 2023-05-12: 0.6 mg via INTRAVENOUS
  Administered 2023-05-12: 4 mg via INTRAVENOUS
  Administered 2023-05-12: 0.2 mg via INTRAVENOUS
  Filled 2023-05-11: qty 30

## 2023-05-11 MED ORDER — POLYETHYLENE GLYCOL 3350 17 G PO PACK
17.0000 g | PACK | Freq: Every day | ORAL | Status: DC
  Administered 2023-05-12 – 2023-05-13 (×2): 17 g via ORAL
  Filled 2023-05-11 (×2): qty 1

## 2023-05-11 MED ORDER — SODIUM CHLORIDE 0.9% FLUSH
10.0000 mL | Freq: Two times a day (BID) | INTRAVENOUS | Status: DC
  Administered 2023-05-11: 20 mL
  Administered 2023-05-11 – 2023-05-13 (×4): 10 mL

## 2023-05-11 MED ORDER — POTASSIUM CHLORIDE 10 MEQ/100ML IV SOLN
INTRAVENOUS | Status: AC
  Administered 2023-05-11: 10 meq via INTRAVENOUS
  Filled 2023-05-11: qty 100

## 2023-05-11 MED ORDER — SODIUM CHLORIDE 0.9% FLUSH: 10.0000 mL | INTRAVENOUS | Status: DC | PRN

## 2023-05-11 MED ORDER — SENNA 8.6 MG PO TABS
2.0000 | ORAL_TABLET | Freq: Two times a day (BID) | ORAL | Status: DC
  Administered 2023-05-11 – 2023-05-13 (×5): 17.2 mg via ORAL
  Filled 2023-05-11 (×5): qty 2

## 2023-05-11 MED ORDER — POTASSIUM CHLORIDE 10 MEQ/100ML IV SOLN
10.0000 meq | INTRAVENOUS | Status: AC
  Administered 2023-05-11 (×3): 10 meq via INTRAVENOUS
  Filled 2023-05-11 (×3): qty 100

## 2023-05-11 MED ORDER — DEXAMETHASONE SODIUM PHOSPHATE 10 MG/ML IJ SOLN
8.0000 mg | Freq: Every day | INTRAMUSCULAR | Status: DC
  Administered 2023-05-11 – 2023-05-13 (×3): 8 mg via INTRAVENOUS
  Filled 2023-05-11 (×3): qty 1

## 2023-05-11 MED ORDER — POTASSIUM CHLORIDE CRYS ER 20 MEQ PO TBCR
40.0000 meq | EXTENDED_RELEASE_TABLET | Freq: Once | ORAL | Status: AC
  Administered 2023-05-11: 40 meq via ORAL
  Filled 2023-05-11: qty 2

## 2023-05-11 MED ORDER — MINERAL OIL RE ENEM: 1.0000 | ENEMA | Freq: Every day | RECTAL | Status: DC | PRN

## 2023-05-11 MED ORDER — ENSURE ENLIVE PO LIQD
237.0000 mL | Freq: Two times a day (BID) | ORAL | Status: DC
  Administered 2023-05-11 – 2023-05-12 (×3): 237 mL via ORAL
  Filled 2023-05-11 (×2): qty 237

## 2023-05-11 MED ORDER — DIPHENHYDRAMINE HCL 12.5 MG/5ML PO ELIX: 12.5000 mg | ORAL_SOLUTION | Freq: Four times a day (QID) | ORAL | Status: DC | PRN

## 2023-05-11 MED ORDER — METOCLOPRAMIDE HCL 5 MG/ML IJ SOLN
5.0000 mg | Freq: Three times a day (TID) | INTRAMUSCULAR | Status: DC
  Administered 2023-05-11 – 2023-05-13 (×6): 5 mg via INTRAVENOUS
  Filled 2023-05-11 (×6): qty 2

## 2023-05-11 NOTE — Progress Notes (Signed)

## 2023-05-11 NOTE — ED Notes (Signed)
RN attempted to call secretary and inform them patient is ready to be transferred, no answer, charge RN made aware

## 2023-05-11 NOTE — Consult Note (Signed)
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 Last seen by our department and this writer in 06/2002 for this problem. Numerous comorbid conditions exist that would impair wound healing. This consult is performed remotely after review of the EMR. Wound type:Pressure, infection Pressure Injury POA: Yes Measurement:To be obtained by Bedside RN with placement of next dressing today. Communicated to Bedside RN H. Socin via [employer]. Wound bed: Per EDP Evaluation, Pink, moist Dressing procedure/placement/frequency: The main elements of the previous POC are still appropriate, specifically, to turn and reposition patient off of the supine position, to implement a pressure redistribution heel boot for the left foot and topical care to the sacral pressure injury using a moisture retentive, antimicrobial wound gel (leptospermum Manuka honey, MediHoney). The Manuka honey is to be topped with dry gauze and secured with a silicone foam placed so that the "tip" is pointing away from the anus.  WOC nursing team will not follow, but will remain available to this patient, the nursing and medical teams.  Please re-consult if needed.  Thank you for inviting [place] to participate in this patient's Plan of Care.  Ladona Mow, MSN, RN, CNS, GNP, Leda Min, [employer], [employer] phone:  [phone]

## 2023-05-11 NOTE — Plan of Care (Addendum)
IR was requested for image guided liver lesion biopsy.   Case was reviewed by Dr. Bryn Gulling, liver lesion is amenable for biopsy.  Dr. Myna Hidalgo from oncology and Dr. Patterson Hammersmith from palliative care note reviewed.   Patient seen at the bedside, he is laying in bed and appears to be comfortable. RN at bedside.  Patient was informed that CT has showed that he has metastatic cancer, and team is unsure if there will be any systemic chemotherapy that can be offered to the patient.  It seem like performing a biopsy just to obtain a diagnosis is not in his best interest as liver lesion biopsy comes with high bleeding risk, and if the liver lesion is metastasis from RCC, it will further increase the bleeding risk.  After discussion, patient verbalized that he does not need the biopsy done.    Ordering provider/palliative team notified.   Will discontinue the liver lesion biopsy order.  It was pleasure meeting with Jeffrey Fields.  Please call IR for questions and concerns.    H  PA-C 05/11/2023 2:25 PM

## 2023-05-11 NOTE — Consult Note (Signed)
Consultation Note Date: 05/11/2023   Patient Name: Jeffrey Fields  DOB: 1956/01/29  MRN: [id number]  Age / Sex: 67 y.o., male   PCP: Verdell Face, DO Referring Physician: Alberteen Sam, *  Reason for Consultation: Establishing goals of care and symptom management     Chief Complaint/History of Present Illness:   Patient is a 67 year old male with a past medical history of paraplegia secondary to spinal stenosis, PVD status post AKA, type 2 diabetes mellitus, hypertension, PAF, hyperlipidemia, anemia, CAD, and chronic sacral ulcer with chronic osteomyelitis was admitted on 05/10/2023 for management of emesis.  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  Liver mass is causing a mass effect on the proximal duodenum leading to marked stomach distention.  Imaging also noted stool burden with possible fecal impaction.  Oncology has been consulted valuation.  Palliative medicine team consulted to assist with complex medical decision making and symptom management.   Extensive review of EMR prior to presenting to bedside.  At time of EMR review patient has received Zofran 4 mg x 1 dose.  Patient also received Percocet 7.5 mg x 2 doses.  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 Individuals present for the conversation: Patient participated in entire conversation with the palliative medicine provider.   Summary of the conversation:  Presented to bedside in the ER to evaluate patient.  Patient laying in bed and appears  uncomfortable.  No family present at bedside.  Introduced myself and the role of the palliative medicine team in patient's care.  Spent time learning about patient's worsening medical status with time including worsening nausea and vomiting over the past week.  Patient also states he has been rapidly losing weight.  Patient notes he has been paraplegic for past 10-15 years and so has been bed bound that entire time.  Patient notes he has an aide who helps him with care at home.  Inquired about family support and patient notes that he has a long-term partner, Jeffrey Fields.  They are not married.  Patient notes he does have 1 daughter, Jeffrey Fields.  Patient states that if he was unable to make medical decisions for himself, he would want his daughter Jeffrey Fields to be his Museum/gallery exhibitions officer.  Based on [place] regulations, since patient is not married, his only adult child over the age of 51 would be his medical decision maker.  Spent time reviewing patient's imaging and planning moving forward.  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  Spent time explaining what metastatic and stage IV meeting.  Then discussed concern that with patient's bed bound status and sacral ulcer, patient is not an appropriate candidate for cancer directed therapies as these would likely cause more harm than benefit with his poor functional status.  Inquired plan for patient moving forward and he noted he was coming into the hospital for a "procedure" though could not tell me what this procedure was going to be for.  Discussed patient likely scheduled for biopsy to determine what type of cancer he has.  Spent time discussing with patient if he would want to undergo a biopsy for  a diagnosis if he is not going to be a candidate for cancer directed therapies.  Patient acknowledged this and that he would likely not want to undergo a procedure if not changing the medical  outcome.  Discussed what patient would be hoping for moving forward to which patient noted he wants to be comfortable and have his symptoms manage since this miserable feeling nauseous.  Acknowledged this and introduced idea of comfort focused care.  Noted patient does not have to undergo biopsy to receive this.  Patient will consider comfort focused care only.  With permission, also able to discuss patient's CODE STATUS.  Explained in detail full code versus DO NOT RESUSCITATE.  Expressed concern that should patient be sick enough his heart stops or he stops breathing, cardiac resuscitation and intubation on mechanical ventilation would not lead to quality of life moving forward in setting of metastatic cancer.  Patient acknowledged this and noted he would never want to be on life support.  Patient agreeing with change of CODE STATUS to DNR at this time.  Outcome of the conversations and/or documents completed:  Change code status to DNR  I spent 29 minutes providing separately identifiable ACP services with the patient and/or surrogate decision maker in a voluntary, in-person conversation discussing the patient's wishes and goals as detailed in the above note.  Alvester Morin, DO Palliative Care Provider  -------------------------------------------------------------------------------------------------------------  Spent time reviewing symptoms with patient.  Noting abdominal pain that worsens with movement.  Patient feels very bloated.  Patient notes continuous sensation of nausea.  During conversation patient vomited twice.  Patient noted he has been on Percocet 7.5 mg for about 10 years so not really noticing pain improvement at this time.  Discussed stone patient's current discomfort and probably lack of absorption from oral medications, will start patient on PCA to allow for better dose finding of medications to consider possibly transdermal medicines for pain management.  Spent time discussing risk  and benefits of PCA.  Patient agreeing with transition to PCA at this time.  Also discussed starting patient on motility agents since no signs of gastric obstruction.  Also going to start patient on steroids to assist with inflammation management.  Patient agreeing with these medical changes.  Could not continue further discussion with patient due to continued high symptom burden including vomiting during conversation.  Noted would order medications to assist with symptom management and continue discussions as able.  Patient agreeing with this plan.  Updated IDT after visit with patient.   Primary Diagnoses  Present on Admission:  Essential hypertension  Hypokalemia  Hyperlipidemia  Intractable nausea and vomiting  chronic Osteomyelitis (HCC)  Chronic anemia   Palliative Review of Systems: Abdominal pain, nausea with vomiting  Past Medical History:  Diagnosis Date   Diabetes mellitus    Hypertension    Paralysis (HCC)    Spinal stenosis    Social History   Socioeconomic History   Marital status: Single    Spouse name: Not on file   Number of children: Not on file   Years of education: Not on file   Highest education level: Not on file  Occupational History   Not on file  Tobacco Use   Smoking status: Never   Smokeless tobacco: Never  Substance and Sexual Activity   Alcohol use: No   Drug use: No   Sexual activity: Not on file  Other Topics Concern   Not on file  Social History Narrative   Not on file  Social Determinants of Health   Financial Resource Strain: Not on file  Food Insecurity: Patient Declined (06/23/2022)   Hunger Vital Sign    Worried About Running Out of Food in the Last Year: Patient declined    Ran Out of Food in the Last Year: Patient declined  Transportation Needs: Not on file  Physical Activity: Not on file  Stress: Not on file  Social Connections: Not on file   History reviewed. No pertinent family history. Scheduled Meds:   acetaminophen  1,000 mg Oral Once   docusate sodium  100 mg Oral Daily   feeding supplement  237 mL Oral BID BM   insulin aspart  0-9 Units Subcutaneous TID WC   metoprolol tartrate  50 mg Oral BID   pantoprazole (PROTONIX) IV  40 mg Intravenous Q12H   sodium chloride flush  10-40 mL Intracatheter Q12H   Continuous Infusions:  0.9 % NaCl with KCl 20 mEq / L 75 mL/hr at 05/11/23 0801   cefTRIAXone (ROCEPHIN)  IV     potassium chloride 10 mEq (05/11/23 0854)   PRN Meds:.acetaminophen **OR** acetaminophen, ondansetron **OR** ondansetron (ZOFRAN) IV, oxyCODONE-acetaminophen, sodium chloride flush No Known Allergies CBC:    Component Value Date/Time   WBC 13.0 (H) 05/11/2023 0605   HGB 8.3 (L) 05/11/2023 0605   HCT 27.9 (L) 05/11/2023 0605   PLT 542 (H) 05/11/2023 0605   MCV 68.2 (L) 05/11/2023 0605   NEUTROABS 11.6 (H) 05/10/2023 1530   LYMPHSABS 0.8 05/10/2023 1530   MONOABS 1.0 05/10/2023 1530   EOSABS 0.0 05/10/2023 1530   BASOSABS 0.0 05/10/2023 1530   Comprehensive Metabolic Panel:    Component Value Date/Time   NA 138 05/11/2023 0605   K 2.5 (LL) 05/11/2023 0605   CL 93 (L) 05/11/2023 0605   CO2 36 (H) 05/11/2023 0605   BUN 21 05/11/2023 0605   CREATININE 0.74 05/11/2023 0605   GLUCOSE 102 (H) 05/11/2023 0605   CALCIUM 8.0 (L) 05/11/2023 0605   AST 13 (L) 05/11/2023 0605   ALT 13 05/11/2023 0605   ALKPHOS 98 05/11/2023 0605   BILITOT 0.8 05/11/2023 0605   PROT 7.5 05/11/2023 0605   ALBUMIN 2.2 (L) 05/11/2023 0605    Physical Exam: Vital Signs: BP (!) 151/100   Pulse (!) 106   Temp 98.3 F (36.8 C) (Oral)   Resp 15   Ht 6\' 3"  (1.905 m)   Wt 83.1 kg   SpO2 96%   BMI 22.90 kg/m  SpO2: SpO2: 96 % O2 Device: O2 Device: Room Air O2 Flow Rate: O2 Flow Rate (L/min): 2 L/min Intake/output summary:  Intake/Output Summary (Last 24 hours) at 05/11/2023 4401 Last data filed at 05/11/2023 0272 Gross per 24 hour  Intake 2049.38 ml  Output --  Net 2049.38 ml   LBM:    Baseline Weight: Weight: 83.1 kg Most recent weight: Weight: 83.1 kg  General: Grimacing at times, cachectic, frail, chronically ill-appearing Eyes: No drainage noted HENT: moist mucous membranes Cardiovascular: Tachycardia noted Respiratory: Slightly increased work of breathing noted, not in respiratory distress Abdomen: distended Extremities: Muscle wasting present in extremities, R AKA Skin: Sacral ulcer (EMR) Neuro: A&Ox4, following commands easily Psych: appropriately answers all questions          Palliative Performance Scale: 20%              Additional Data Reviewed: Recent Labs    05/10/23 1607 05/10/23 2059 05/11/23 0605  WBC  --  13.2* 13.0*  HGB 8.2*  6.1* 8.3*  PLT  --  557* 542*  NA 138  --  138  BUN 23  --  21  CREATININE 1.10  --  0.74    Imaging: CT Angio Chest PE W and/or Wo Contrast CLINICAL DATA:  Abdominal pain, nausea and vomiting, diarrhea for 2 days, abdominal distension  EXAM: CT ANGIOGRAPHY CHEST  CT ABDOMEN AND PELVIS WITH CONTRAST  TECHNIQUE: Multidetector CT imaging of the chest was performed using the standard protocol during bolus administration of intravenous contrast. Multiplanar CT image reconstructions and MIPs were obtained to evaluate the vascular anatomy. Multidetector CT imaging of the abdomen and pelvis was performed using the standard protocol during bolus administration of intravenous contrast.  RADIATION DOSE REDUCTION: This exam was performed according to the departmental dose-optimization program which includes automated exposure control, adjustment of the mA and/or kV according to patient size and/or use of iterative reconstruction technique.  CONTRAST:  OMNIPAQUE IOHEXOL 350 MG/ML SOLN  COMPARISON:  11/26/2021  FINDINGS: CTA CHEST FINDINGS  Cardiovascular: This is a technically adequate evaluation of the pulmonary vasculature. No filling defects or pulmonary emboli.  Prominent left ventricular  dilatation again noted. No pericardial effusion. Ascending thoracic aortic aneurysm measuring up to 4.1 cm. No evidence of dissection. Atherosclerosis of the aorta and coronary vasculature.  Mediastinum/Nodes: No enlarged mediastinal, hilar, or axillary lymph nodes. Thyroid gland, trachea, and esophagus demonstrate no significant findings.  Lungs/Pleura: No acute airspace disease, effusion, or pneumothorax. Central airways are patent.  Musculoskeletal: No acute or destructive bony abnormalities. Reconstructed images demonstrate no additional findings.  Review of the MIP images confirms the above findings.  CT ABDOMEN and PELVIS FINDINGS  Hepatobiliary: Since the prior exam, numerous large hypodense masses have replace the majority of the liver parenchyma, consistent with progressive primary malignancy or worsening metastatic disease. Largest mass occupying the majority of the right lobe liver image 45/2 measures 22.4 x 16.6 cm. Gallbladder is unremarkable. No biliary duct dilation.  Pancreas: Unremarkable. No pancreatic ductal dilatation or surrounding inflammatory changes.  Spleen: Normal in size without focal abnormality.  Adrenals/Urinary Tract: There is a large soft tissue mass in the left suprarenal fossa, which may be associated with the lateral limb of the left adrenal gland, measuring up to 7.2 x 4.8 cm reference image 20/2, consistent with metastatic disease. The right adrenal appears unremarkable.  There is a solid mass within the upper pole right kidney measuring 3.1 x 2.7 cm, concerning for renal cell carcinoma. This is not changed significantly in size since prior study. There are numerous other hypodensity seen throughout the bilateral renal parenchyma, most too small to characterize accurately.  There are bilateral nonobstructing renal calculi, measuring up to 9 mm on the right and 11 mm on the left. No obstructive uropathy within either kidney.  Chronic  bladder wall thickening, trabeculations, and multiple diverticula consistent with chronic bladder outlet obstruction.  Stomach/Bowel: There is a large amount of stool within the rectosigmoid colon, consistent with constipation. A large amount of stool in the rectal vault may reflect fecal impaction.  No evidence of bowel obstruction or ileus. There is marked distension of the stomach. This may be due to extrinsic compression upon the proximal duodenum by the enlarging hepatic masses described above. Decompression with enteric catheter may be useful.  Vascular/Lymphatic: Aortic atherosclerosis. No discrete adenopathy identified within the abdomen or pelvis.  Reproductive: The prostate is not enlarged.  Other: There is no free fluid or free intraperitoneal gas. No abdominal wall hernia.  Musculoskeletal:  Progressive decubitus ulceration overlying the sacrum and coccyx. Underlying bony erosion and sclerosis consistent with osteomyelitis, likely chronic. There are no acute displaced fractures. Severe degenerative changes of the lumbar spine and bilateral hips. Reconstructed images demonstrate no additional findings.  Review of the MIP images confirms the above findings.  IMPRESSION: Chest:  1. No evidence of pulmonary embolus. 2. No acute intrathoracic process. 3. 4.1 cm ascending thoracic aortic aneurysm. Recommend annual imaging followup by CTA or MRA. This recommendation follows 2010 ACCF/AHA/AATS/ACR/ASA/SCA/SCAI/SIR/STS/SVM Guidelines for the Diagnosis and Management of Patients with Thoracic Aortic Disease. Circulation. 2010; 121: T557-D220. Aortic aneurysm NOS (ICD10-I71.9)  Abdomen/pelvis:  1. New and enlarging masses throughout the liver, consistent with progressive metastatic disease or worsening primary liver malignancy since prior exam. 2. Mass effect on the proximal duodenum by the enlarging right lobe liver mass described above, with marked distension of the  stomach. Decompression of the stomach with enteric catheter may be useful. 3. 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 4. Solid mass upper pole right kidney suspicious for renal cell carcinoma. Numerous other hypodensities throughout the liver parenchyma are too small to characterize. 5. Sacral decubitus ulcer, with evidence of underlying chronic osteomyelitis. 6. Large amount of retained stool within the distal colon consistent with constipation, with stool ball in the rectal vault concerning for fecal impaction. No bowel obstruction. 7. Bilateral nonobstructing renal calculi. 8.  Aortic Atherosclerosis (ICD10-I70.0). 9. Chronic bladder wall thickening, trabeculations, and diverticula consistent with chronic bladder outlet obstruction.  Electronically Signed   By: Sharlet Fields M.D.   On: 05/10/2023 18:12 CT ABDOMEN PELVIS W CONTRAST CLINICAL DATA:  Abdominal pain, nausea and vomiting, diarrhea for 2 days, abdominal distension  EXAM: CT ANGIOGRAPHY CHEST  CT ABDOMEN AND PELVIS WITH CONTRAST  TECHNIQUE: Multidetector CT imaging of the chest was performed using the standard protocol during bolus administration of intravenous contrast. Multiplanar CT image reconstructions and MIPs were obtained to evaluate the vascular anatomy. Multidetector CT imaging of the abdomen and pelvis was performed using the standard protocol during bolus administration of intravenous contrast.  RADIATION DOSE REDUCTION: This exam was performed according to the departmental dose-optimization program which includes automated exposure control, adjustment of the mA and/or kV according to patient size and/or use of iterative reconstruction technique.  CONTRAST:  OMNIPAQUE IOHEXOL 350 MG/ML SOLN  COMPARISON:  11/26/2021  FINDINGS: CTA CHEST FINDINGS  Cardiovascular: This is a technically adequate evaluation of the pulmonary vasculature.  No filling defects or pulmonary emboli.  Prominent left ventricular dilatation again noted. No pericardial effusion. Ascending thoracic aortic aneurysm measuring up to 4.1 cm. No evidence of dissection. Atherosclerosis of the aorta and coronary vasculature.  Mediastinum/Nodes: No enlarged mediastinal, hilar, or axillary lymph nodes. Thyroid gland, trachea, and esophagus demonstrate no significant findings.  Lungs/Pleura: No acute airspace disease, effusion, or pneumothorax. Central airways are patent.  Musculoskeletal: No acute or destructive bony abnormalities. Reconstructed images demonstrate no additional findings.  Review of the MIP images confirms the above findings.  CT ABDOMEN and PELVIS FINDINGS  Hepatobiliary: Since the prior exam, numerous large hypodense masses have replace the majority of the liver parenchyma, consistent with progressive primary malignancy or worsening metastatic disease. Largest mass occupying the majority of the right lobe liver image 45/2 measures 22.4 x 16.6 cm. Gallbladder is unremarkable. No biliary duct dilation.  Pancreas: Unremarkable. No pancreatic ductal dilatation or surrounding inflammatory changes.  Spleen: Normal in size without focal abnormality.  Adrenals/Urinary Tract: There is  a large soft tissue mass in the left suprarenal fossa, which may be associated with the lateral limb of the left adrenal gland, measuring up to 7.2 x 4.8 cm reference image 20/2, consistent with metastatic disease. The right adrenal appears unremarkable.  There is a solid mass within the upper pole right kidney measuring 3.1 x 2.7 cm, concerning for renal cell carcinoma. This is not changed significantly in size since prior study. There are numerous other hypodensity seen throughout the bilateral renal parenchyma, most too small to characterize accurately.  There are bilateral nonobstructing renal calculi, measuring up to 9 mm on the right and 11 mm  on the left. No obstructive uropathy within either kidney.  Chronic bladder wall thickening, trabeculations, and multiple diverticula consistent with chronic bladder outlet obstruction.  Stomach/Bowel: There is a large amount of stool within the rectosigmoid colon, consistent with constipation. A large amount of stool in the rectal vault may reflect fecal impaction.  No evidence of bowel obstruction or ileus. There is marked distension of the stomach. This may be due to extrinsic compression upon the proximal duodenum by the enlarging hepatic masses described above. Decompression with enteric catheter may be useful.  Vascular/Lymphatic: Aortic atherosclerosis. No discrete adenopathy identified within the abdomen or pelvis.  Reproductive: The prostate is not enlarged.  Other: There is no free fluid or free intraperitoneal gas. No abdominal wall hernia.  Musculoskeletal: Progressive decubitus ulceration overlying the sacrum and coccyx. Underlying bony erosion and sclerosis consistent with osteomyelitis, likely chronic. There are no acute displaced fractures. Severe degenerative changes of the lumbar spine and bilateral hips. Reconstructed images demonstrate no additional findings.  Review of the MIP images confirms the above findings.  IMPRESSION: Chest:  1. No evidence of pulmonary embolus. 2. No acute intrathoracic process. 3. 4.1 cm ascending thoracic aortic aneurysm. Recommend annual imaging followup by CTA or MRA. This recommendation follows 2010 ACCF/AHA/AATS/ACR/ASA/SCA/SCAI/SIR/STS/SVM Guidelines for the Diagnosis and Management of Patients with Thoracic Aortic Disease. Circulation. 2010; 121: G644-I347. Aortic aneurysm NOS (ICD10-I71.9)  Abdomen/pelvis:  1. New and enlarging masses throughout the liver, consistent with progressive metastatic disease or worsening primary liver malignancy since prior exam. 2. Mass effect on the proximal duodenum by the enlarging  right lobe liver mass described above, with marked distension of the stomach. Decompression of the stomach with enteric catheter may be useful. 3. 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 4. Solid mass upper pole right kidney suspicious for renal cell carcinoma. Numerous other hypodensities throughout the liver parenchyma are too small to characterize. 5. Sacral decubitus ulcer, with evidence of underlying chronic osteomyelitis. 6. Large amount of retained stool within the distal colon consistent with constipation, with stool ball in the rectal vault concerning for fecal impaction. No bowel obstruction. 7. Bilateral nonobstructing renal calculi. 8.  Aortic Atherosclerosis (ICD10-I70.0). 9. Chronic bladder wall thickening, trabeculations, and diverticula consistent with chronic bladder outlet obstruction.  Electronically Signed   By: Sharlet Fields M.D.   On: 05/10/2023 18:12    I personally reviewed recent imaging.   Palliative Care Assessment and Plan Summary of Established Goals of Care and Medical Treatment Preferences   Patient is a 67 year old male with a past medical history of paraplegia secondary to spinal stenosis, PVD status post AKA, type 2 diabetes mellitus, hypertension, PAF, hyperlipidemia, anemia, CAD, and chronic sacral ulcer with chronic osteomyelitis was admitted on 05/10/2023 for management of emesis.  Since admission, patient has had imaging that has shown a solid  mass in the upper pole of his right kidney suspicious for RCC with metastatic disease to liver and adrenal glands.  Liver mass is causing a mass effect on the proximal duodenum leading to marked stomach distention.  Imaging also noted stool burden with possible fecal impaction.  Oncology has been consulted valuation.  Palliative medicine team consulted to assist with complex medical decision making and symptom management.   # Complex medical decision  making/goals of care  -Extensive discussion with patient as detailed above in HPI.  Discussed patient's new diagnosis of metastatic cancer with likely renal primary.  Patient has poor functional status with being bed bound from paraplegia for past 10-15 years.  Even if patient were to undergo biopsy, would not change medical management for cancer since patient is not a candidate for cancer directed therapies in setting of poor functional status.  Patient considering if he wants to undergo biopsy to determine diagnosis versus transition to full comfort focused care.  At this time continuing with aggressive symptom management.  Palliative medicine team planning to continue discussions with patient regarding goals for medical care moving forward as able.  -Patient stated that if he was unable to make medical decisions for himself, he would want his daughter Jeffrey Fields, to make medical decisions on his behalf.  Patient does have a long-term partner, Jeffrey Fields, and notes that they are not married.  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  No continuous at this time.  Continue to adjust based on patient's symptom burden.   -Start IV Dilaudid 0.5 mg every hour as needed for breakthrough pain management.   -Nausea/vomiting   -As above, using dexamethasone   -Start Reglan IV 5 mg every 8 hours scheduled since no sign of gastric obstruction   -Continue Zofran 4 mg every 6 hours as needed   -EKG on 8/2 showed QTc of 476   -Constipation   -Discontinue Colace   -Start senna 2 tabs twice daily     -Start MiraLAX 17 g daily tomorrow   -Start mineral oil enema daily as needed  # Psycho-social/Spiritual Support:  - Support System: daughterKennyth Fields, long term significant other- Jeffrey Fields  #  Discharge Planning:  To Be Determined  -Patient appropraite for hospice. Will continue discussions as able.   Thank you for allowing the palliative care team to participate in the care Omkar M Fields.  Alvester Morin, DO Palliative Care Provider PMT # [phone]  If patient remains symptomatic despite maximum doses, please call PMT at [phone] between 0700 and 1900. Outside of these hours, please call attending, as PMT does not have night coverage.  *Please note that this is a verbal dictation therefore any spelling or grammatical errors are due to the "Dragon [employer] One" system interpretation.

## 2023-05-11 NOTE — ED Notes (Signed)
Patient's midline is not infusing. Blood switched to 20G Right forearm IV. Potassium paused at this time.

## 2023-05-11 NOTE — Progress Notes (Signed)
  Progress Note   Patient: Jeffrey Fields ZOX:[id number] DOB: 08/18/1956 DOA: 05/10/2023     0 DOS: the patient was seen and examined on 05/11/2023        Brief hospital course: Jeffrey Fields is a 67 y.o. M with spinal stenosis, bedbound, hx R AKA, DM, HTN pAF not on AC and chronic decubitus ulcer with chronic osteomyelitis who presented with vomiting.  In the ER, found to have liver metastases and gastric outlet obstruction. Oncology and Palliative Care and IR consulted.     Assessment and Plan: * Intractable nausea and vomiting due to gastric outlet obstruction from newly discovered metastatic disease Metastatic disease of unknown primary [facility]) Oncology consulted.  They suspect renal primary.    Palliative consulted. Patient reported to Oncology and me that he wished for liver biopsy.  He reported to Palliative Care and IR that he did not.    He is not a candidate for systemic therapy given his advanced systemic illness and poor functional status.  In light of that, I agree that biopsy provides no substantial benefit, and I recommend Hospice.  Refused NG - Consult Palliative Care - Continue steroids, PCA pump, Reglan - Consult Hospice  - Defer discussion of venting PEG to Palliative Care   Hypokalemia - Continue K supplement  Chronic Osteomyelitis (HCC) Sacral decubitus ulcer, stage IV, POA - Recommend Hospice   Chronic anemia Baseline ~8.    Acute cystitis - Continue Rocephin, day 2   Paroxysmal atrial fibrillation (HCC) Ischemic cardiomyopathy Coronary disease Hypertension Not on anticoagulation.  Never followed up with Cardiology EF previously 25 to 30%  Appears dry on exam - Continue metoprolol   Hyperlipidemia Nonadherent with statin  Type 2 diabetes mellitus (HCC) - Continue SS corrections  S/P AKA (above knee amputation), right (HCC)  Spinal stenosis with paraplegia - Continue home analgesics          Subjective: patient reported to me he  felt fine.  Later reported to Palliative care severe abdominal pain and vomiting.  No fever, no confusion.       Physical Exam: BP (!) 144/89 (BP Location: Left Arm)   Pulse (!) 108   Temp 98.4 F (36.9 C) (Oral)   Resp 20   Ht 6\' 3"  (1.905 m)   Wt 83.1 kg   SpO2 100%   BMI 22.90 kg/m   Thin adult male, lying in bed, no acute distress RRR no murmurs no LE edema Diffusely decrased muscle mass and fat Attention normal affect normal  Data Reviewed: Discussed with Palliative Care BMP shows persistent hypokalemia Renal function stable CBC reviewed  Family Communication:      Disposition: Status is: Inpatient Paitent presented with new metastatic disease.  He will need PCA pump started today.  When palliative care are able to negotiate a plan whether to place a venting gastrostomy or not, and have transitioned him from IV to PO pain medication, he may be able to discuarhge home.    He may also deteriorate and require inatpeitn residential hospice        Author: Alberteen Sam, MD 05/11/2023 8:20 PM  For on call review [email or web address].

## 2023-05-11 NOTE — ED Notes (Signed)
Pt tolerated initial blood transfusion well without signs of adverse reaction.

## 2023-05-11 NOTE — Hospital Course (Signed)
Jeffrey Fields is a 67 y.o. M with spinal stenosis, bedbound, hx R AKA, DM, HTN pAF not on AC and chronic decubitus ulcer with chronic osteomyelitis who presented with vomiting.  In the ER, found to have liver metastases and gastric outlet obstruction. Oncology and Palliative Care and IR consulted.

## 2023-05-12 DIAGNOSIS — G893 Neoplasm related pain (acute) (chronic): Secondary | ICD-10-CM | POA: Diagnosis not present

## 2023-05-12 DIAGNOSIS — R112 Nausea with vomiting, unspecified: Secondary | ICD-10-CM | POA: Diagnosis not present

## 2023-05-12 DIAGNOSIS — M4800 Spinal stenosis, site unspecified: Secondary | ICD-10-CM

## 2023-05-12 DIAGNOSIS — Z7189 Other specified counseling: Secondary | ICD-10-CM

## 2023-05-12 DIAGNOSIS — L89154 Pressure ulcer of sacral region, stage 4: Secondary | ICD-10-CM | POA: Diagnosis not present

## 2023-05-12 DIAGNOSIS — R6510 Systemic inflammatory response syndrome (SIRS) of non-infectious origin without acute organ dysfunction: Secondary | ICD-10-CM

## 2023-05-12 DIAGNOSIS — Z89611 Acquired absence of right leg above knee: Secondary | ICD-10-CM

## 2023-05-12 DIAGNOSIS — R1114 Bilious vomiting: Secondary | ICD-10-CM

## 2023-05-12 LAB — GLUCOSE, CAPILLARY: Glucose-Capillary: 105 mg/dL — ABNORMAL HIGH (ref 70–99)

## 2023-05-12 MED ORDER — POLYVINYL ALCOHOL 1.4 % OP SOLN: 1.0000 [drp] | Freq: Four times a day (QID) | OPHTHALMIC | Status: DC | PRN

## 2023-05-12 MED ORDER — LORAZEPAM 2 MG/ML PO CONC: 0.5000 mg | ORAL | Status: DC | PRN

## 2023-05-12 MED ORDER — HYDROMORPHONE 1 MG/ML IV SOLN
INTRAVENOUS | Status: DC
  Administered 2023-05-12: 0.4 mg via INTRAVENOUS
  Administered 2023-05-12: 0.8 mg via INTRAVENOUS
  Administered 2023-05-13: 0.6 mg via INTRAVENOUS
  Administered 2023-05-13: 0.8 mg via INTRAVENOUS

## 2023-05-12 MED ORDER — RIFAXIMIN 550 MG PO TABS
550.0000 mg | ORAL_TABLET | Freq: Three times a day (TID) | ORAL | Status: DC
  Administered 2023-05-12 – 2023-05-13 (×4): 550 mg via ORAL
  Filled 2023-05-12 (×5): qty 1

## 2023-05-12 MED ORDER — HALOPERIDOL LACTATE 2 MG/ML PO CONC: 0.5000 mg | ORAL | Status: DC | PRN

## 2023-05-12 MED ORDER — BIOTENE DRY MOUTH MT LIQD: 15.0000 mL | OROMUCOSAL | Status: DC | PRN

## 2023-05-12 MED ORDER — SUCRALFATE 1 GM/10ML PO SUSP
1.0000 g | Freq: Three times a day (TID) | ORAL | Status: DC
  Administered 2023-05-12 – 2023-05-13 (×5): 1 g via ORAL
  Filled 2023-05-12 (×5): qty 10

## 2023-05-12 MED ORDER — HALOPERIDOL LACTATE 5 MG/ML IJ SOLN: 0.5000 mg | INTRAMUSCULAR | Status: DC | PRN

## 2023-05-12 MED ORDER — HALOPERIDOL 0.5 MG PO TABS: 0.5000 mg | ORAL_TABLET | ORAL | Status: DC | PRN

## 2023-05-12 MED ORDER — GLYCOPYRROLATE 0.2 MG/ML IJ SOLN: 0.2000 mg | INTRAMUSCULAR | Status: DC | PRN

## 2023-05-12 MED ORDER — SODIUM CHLORIDE 0.9 % IV SOLN
250.0000 mg | Freq: Every day | INTRAVENOUS | Status: DC
  Administered 2023-05-12: 250 mg via INTRAVENOUS
  Filled 2023-05-12: qty 20

## 2023-05-12 MED ORDER — GLYCOPYRROLATE 1 MG PO TABS: 1.0000 mg | ORAL_TABLET | ORAL | Status: DC | PRN

## 2023-05-12 NOTE — Progress Notes (Signed)
Daily Progress Note   Patient Name: Jeffrey Fields       Date: 05/12/2023 DOB: 1956-04-12  Age: 67 y.o. MRN#: [id number] Attending Physician: Dorcas Carrow, MD Primary Care Physician: Verdell Face, DO Admit Date: 05/10/2023 Length of Stay: 1 day  Reason for Consultation/Follow-up: Establishing goals of care and symptom management  Subjective:   CC: Patient notes his goal is to get his symptoms better improved so we can share the time at the end of life at home with his family.  Following up regarding complex medical decision making and symptom management.  Subjective:  Reviewed EMR prior to presenting to bedside.  Patient has discussed with IR that he does not want to undergo biopsy for workup of his cancer.  Patient has stated to oncologist he would not want to receive cancer directed therapies even if he was diagnosed with a certain cancer.  Patient is also not appropriate candidate for cancer directed therapies due to his bedbound status. Patient has been on IV Dilaudid PCA for pain management.  Also started patient on IV dexamethasone and Reglan as well.  Presented to bedside to meet with patient today.  Patient remembers this provider from visiting in the ER.  Patient again able to discussed that his goal is to get his symptoms improved so that he can go home knowing that he only has a little time left and spend it with his family.  Patient does not want to die in a facility or in the hospital.  Spent time providing emotional support via active listening.  Patient spent time explaining that he had discussed with his significant other and his daughter his diagnosis of cancer.  Patient is just hoping for time with his daughter, grandson, significant other, and son at home.  Discussed that the best avenue to support this would be through hospice to allow symptom management at home at the end of life.  Patient agreeing with involving TOC to assist with home hospice coordination.  Discussed at this  time we will only focus on patient's comfort.  Will not continue interventions that are not comfort focused such as IV fluids, lab work, and imaging.  Continuing antibiotics at this time as will hopefully allow patient more time at home with family.  Spent time discussing patient's symptom burden at this time.  Patient feels that his primary concern at this time is feeling bloated and having gas distention in his abdomen.  Reviewed appropriate interventions for this so noted going to start medications to assist with management.  Also discussed patient's pain management at this time.  Patient does not want this provider to start fentanyl patch though discussed appropriateness with limited oral intake.  Patient wanting to continue his Percocet when he is able to go home.  Patient also notes he is wanting to continue on PCA currently while his symptoms of nausea and vomiting are improved.  Agreed with this plan.  Noted could make adjustments as needed.  All questions answered at that time.  Thanked patient for allowing me to visit with him today.  Noted pallor medicine team will continue following with patient's medical journey.  Updated IDT regarding transition to comfort focused care and plan to get home with hospice, potentially tomorrow if symptoms improved.  Review of Systems Abdominal bloating Objective:   Vital Signs:  BP (!) 151/92 (BP Location: Left Arm)   Pulse (!) 108   Temp 97.9 F (36.6 C) (Oral)   Resp 18   Ht 6\' 3"  (  1.905 m)   Wt 83.1 kg   SpO2 100%   BMI 22.90 kg/m   Physical Exam: General: pleasant, laying in bed, cachectic, frail, chronically ill-appearing Eyes: No drainage noted HENT: moist mucous membranes Cardiovascular: no increased work of breathing noted, not in respiratory distress Abdomen: distended Extremities: Muscle wasting present in extremities, R AKA Skin: Sacral ulcer (EMR) Neuro: A&Ox4, following commands easily Psych: appropriately answers all  questions  Imaging:  I personally reviewed recent imaging.   Assessment & Plan:   Assessment: Patient is a 67 year old male with a past medical history of paraplegia secondary to spinal stenosis, PVD status post AKA, type 2 diabetes mellitus, hypertension, PAF, hyperlipidemia, anemia, CAD, and chronic sacral ulcer with chronic osteomyelitis was admitted on 05/10/2023 for management of emesis.  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  Liver mass is causing a mass effect on the proximal duodenum leading to marked stomach distention.  Imaging also noted stool burden with possible fecal impaction.  Oncology has been consulted valuation.  Palliative medicine team consulted to assist with complex medical decision making and symptom management.    Recommendations/Plan: # Complex medical decision making/goals of care:      -Extensive discussion with patient as detailed above in HPI.  Patient has discussed with multiple care team members that he does not want to undergo any aggressive interventions including biopsy for workup of his cancer.  Patient would not want to seek cancer directed therapies even with a diagnosis, though he is not appropriate for cancer directed therapies based on his bed bound functional status.  Patient wanting to transition to full comfort focused care at this time.  Continuing antibiotics to hopefully allow more time with family at home.  Patient expressed that he does not want to die in a facility or hospital and wants to be able to die at home surrounded by his loved ones including his daughter, grandson, son, and significant other.  Patient agreeing to work to get home with hospice support for symptom management at the end of life.                -Patient stated that if he was unable to make medical decisions for himself, he would want his  daughter Kennyth Arnold, to make medical decisions on his behalf.  Patient does have a long-term partner, [name], and notes that they are not married.                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  Patient wanting to continue IV Dilaudid PCA at this time and transition back to home medication of Percocet when appropriate for discharge.                               -Continue dexamethasone 8 mg IV daily                                        -  Continue IV Dilaudid PCA 0.2 mg every 10 minutes as needed bolus.  No continuous at this time.  Continue to adjust based on patient's symptom burden.                               -Continue IV Dilaudid 0.5 mg every hour as needed for breakthrough pain management.                  -Nausea/vomiting                               -As above, using dexamethasone                               -Continue Reglan IV 5 mg every 8 hours scheduled since no sign of gastric obstruction    -Start Carafate 1gm TID and qhs     -Start ativan 0.5mg  q4hrs prn solution    -Start rifaximin 550mg  TID to assist with bacterial overgrowth that can worsen symptoms of bloating/gas                               -Continue Zofran 4 mg every 6 hours as needed                               -EKG on 8/2 showed QTc of 476                  -Constipation                               -Discontinue Colace                               -Continue senna 2 tabs twice daily                                                  -Continue MiraLAX 17 g daily tomorrow                               -Continue  mineral oil enema daily as needed   #  Psycho-social/Spiritual Support:  - Support System: daughterKennyth Arnold, long term significant other- [name]  # Discharge Planning: Home with Hospice  -Consulted TOC to assist with coordination   Discussed with: patient, RN, hosptialist  Thank you for allowing the palliative care team to participate in the care Johnston M Fields.  Alvester Morin, DO Palliative Care Provider PMT # [phone]  If patient remains symptomatic despite maximum doses, please call PMT at [phone] between 0700 and 1900. Outside of these hours, please call attending, as PMT does not have night coverage.  *Please note that this is a verbal dictation therefore any spelling or grammatical errors are due to the "Dragon Medical One" system interpretation.

## 2023-05-12 NOTE — Progress Notes (Signed)
Jeffrey Fields is now up on 6 E.  He is on a PCA pump.  His pain is doing better.  He is having mild nausea.  He does not want to have any treatment for his cancer regardless of what kind of cancer this is.  As such, I totally agree that we can forego any biopsy.  He is a DO NOT RESUSCITATE, which is totally appropriate for him.  I did talk to him about Hospice.  I do not think he is ready to talk about hospice.  However, Hospice definitely is going to be to his benefit.  His labs that were done yesterday showed a white count of 14.6.  Hemoglobin 8.6.  Platelet count 461,000.  His potassium was 2.5 yesterday.  He clearly is iron deficient.  This is the etiology of his anemia.  We will give him some IV iron.  I am unsure is really eating all that much because of the nausea.  His vital signs are all stable.  Temperature 97.9.  Pulse 108.  Blood pressure 151/92.  His lungs are clear bilaterally.  He has good air movement bilaterally.  Cardiac exam tachycardic but regular.  Abdomen is soft.  Bowel sounds are decreased.  There is hepatomegaly.  Extremities shows the right BKA.  He has muscle atrophy in upper lower extremities.  Neurological exam is not focal.  He definitely looks better which is nice to see.  The IV fluids have helped.  I think pain control is also a factor.  I sent off a prealbumin.  It is incredibly low at 5.  I really am not surprised by this.  I think this is very prognostic for him.  Again, at this point we are all about his quality of life.  However we can help as it would benefit him.  I know that he will get great care from everybody up on 6 E.   Christin Bach, MD  Hebrews 11:1

## 2023-05-12 NOTE — Plan of Care (Signed)

## 2023-05-12 NOTE — Progress Notes (Signed)
PROGRESS NOTE    Jeffrey Fields  AVW:[id number] DOB: [birth year] DOA: 05/10/2023 PCP: Verdell Face, DO    Brief Narrative:  67 year old with history of spinal stenosis and bedbound status, right above-knee amputation, type 2 diabetes, hypertension, chronic decubitus ulcer and chronic osteomyelitis of the sacrum presented the hospital with vomiting and abdominal distention.  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  Oncology, palliative care and IR were consulted.  Patient appropriately declined to have biopsy or further treatment. Currently trying to achieve symptom control to discharge home with hospice.   Assessment & Plan:   Intractable nausea and vomiting due to gastric outlet obstruction from newly discovered metastatic disease, unknown primary.  Multiple metastatic lesion in the liver. Failure to thrive. Severe electrolyte abnormalities Chronic osteomyelitis, bed bound Ischemic cardiomyopathy, paroxysmal atrial fibrillation and coronary artery disease,  With his overall poor clinical status and nearing end-of-life, he was seen and followed by oncology and palliative care.  Patient declined to have liver biopsy as it is not going to change the trajectory of his disease. Remains in the hospital, currently on Dilaudid PCA for symptom control. Converted to comfort care 8/4, All symptom control medications available.  Trying to achieve pain control, nausea control.  Supportive management with the steroids, benzodiazepines. Home hospice arrangements to be made.  Discharge when symptoms are controlled. Unrestricted visitation policy RN may pronounce death if happens in the hospital Continue Rocephin while in the hospital for suspected UTI.  DVT prophylaxis: None.  Comfort care   Code Status: DNR/comfort care Family Communication: None at bedside Disposition Plan: Status is: Inpatient Remains inpatient  appropriate because: Comfort care     Consultants:  Oncology Palliative care  Procedures:  None  Antimicrobials:  Rocephin 8/3---   Subjective: Patient seen in the morning rounds.  Patient tells me that he continues to have abdominal distention.  He asked me whether I can do something for his distended belly.  Passing flatus but no bowel movements yet.  He thinks Percocet is better than Dilaudid PCA for pain control.  He is worried about who is prescribing pain medications for him. Discussed about nearing end-of-life and he tells me he would like to spend his days at home. I was notified after my visit by palliative care provider that patient is converted to comfort care and planning to go home with home hospice.  Objective: Vitals:   05/12/23 0035 05/12/23 0212 05/12/23 0516 05/12/23 0655  BP:  (!) 147/86  (!) 151/92  Pulse:  (!) 110  (!) 108  Resp: 15 16 15 18   Temp:  98.2 F (36.8 C)  97.9 F (36.6 C)  TempSrc:  Oral  Oral  SpO2: 98% 100% 98% 100%  Weight:      Height:        Intake/Output Summary (Last 24 hours) at 05/12/2023 1210 Last data filed at 05/12/2023 8295 Gross per 24 hour  Intake 1754.99 ml  Output 2226 ml  Net -471.01 ml   Filed Weights   05/10/23 1522  Weight: 83.1 kg    Examination:  Sick looking elderly male, lying in bed.  Not in acute distress.  On room air. Diffusely tender abdomen with distention but bowel sound present.     Data Reviewed: I have personally reviewed following labs and imaging studies  CBC: Recent Labs  Lab 05/10/23 1530 05/10/23 1607 05/10/23 2059 05/11/23 0605 05/11/23 1100  WBC 13.5*  --  13.2* 13.0*  14.6*  NEUTROABS 11.6*  --   --   --   --   HGB 7.6* 8.2* 6.1* 8.3* 8.6*  HCT 27.1* 24.0* 21.6* 27.9* 29.1*  MCV 64.7*  --  65.5* 68.2* 70.8*  PLT 673*  --  557* 542* 461*   Basic Metabolic Panel: Recent Labs  Lab 05/10/23 1530 05/10/23 1607 05/11/23 0605 05/11/23 1430  NA 140 138 138  --   K 2.4* 2.4*  2.5* 3.1*  CL 87* 87* 93*  --   CO2 37*  --  36*  --   GLUCOSE 164* 159* 102*  --   BUN 27* 23 21  --   CREATININE 0.74 1.10 0.74  --   CALCIUM 8.5*  --  8.0*  --   MG 2.3  --   --   --    GFR: Estimated Creatinine Clearance: 103.9 mL/min (by C-G formula based on SCr of 0.74 mg/dL). Liver Function Tests: Recent Labs  Lab 05/10/23 1530 05/11/23 0605  AST 13* 13*  ALT 12 13  ALKPHOS 109 98  BILITOT 0.9 0.8  PROT 8.5* 7.5  ALBUMIN 2.4* 2.2*   Recent Labs  Lab 05/10/23 1530  LIPASE 28   No results for input(s): "AMMONIA" in the last 168 hours. Coagulation Profile: No results for input(s): "INR", "PROTIME" in the last 168 hours. Cardiac Enzymes: No results for input(s): "CKTOTAL", "CKMB", "CKMBINDEX", "TROPONINI" in the last 168 hours. BNP (last 3 results) No results for input(s): "PROBNP" in the last 8760 hours. HbA1C: Recent Labs    05/11/23 1100  HGBA1C 4.9   CBG: Recent Labs  Lab 05/10/23 2251 05/11/23 0845 05/11/23 1132 05/12/23 0747  GLUCAP 115* 96 88 105*   Lipid Profile: No results for input(s): "CHOL", "HDL", "LDLCALC", "TRIG", "CHOLHDL", "LDLDIRECT" in the last 72 hours. Thyroid Function Tests: No results for input(s): "TSH", "T4TOTAL", "FREET4", "T3FREE", "THYROIDAB" in the last 72 hours. Anemia Panel: Recent Labs    05/10/23 2100 05/11/23 0605  FERRITIN 50  --   TIBC 169* 189*  IRON 9* 15*   Sepsis Labs: Recent Labs  Lab 05/10/23 2242  LATICACIDVEN 1.7    Recent Results (from the past 240 hour(s))  Urine Culture (for pregnant, neutropenic or urologic patients or patients with an indwelling urinary catheter)     Status: Abnormal (Preliminary result)   Collection Time: 05/10/23  3:30 PM   Specimen: Urine, Clean Catch  Result Value Ref Range Status   Specimen Description   Final    URINE, CLEAN CATCH Performed at [facility], [address] [place], [place] [address]    Special Requests   Final    NONE Performed  at [facility], [address], [place], [place] [address]    Culture (A)  Final    >=100,000 COLONIES/mL GRAM NEGATIVE RODS CULTURE REINCUBATED FOR BETTER GROWTH SUSCEPTIBILITIES TO FOLLOW Performed at [facility], [address], [place], [place] [address]    Report Status PENDING  Incomplete  Culture, blood (Routine X 2) w Reflex to ID Panel     Status: None (Preliminary result)   Collection Time: 05/10/23 10:42 PM   Specimen: BLOOD RIGHT ARM  Result Value Ref Range Status   Specimen Description   Final    BLOOD RIGHT ARM Performed at [facility], [address] [place], [place] [address]    Special Requests   Final    Blood Culture adequate volume BOTTLES DRAWN AEROBIC AND  ANAEROBIC Performed at [facility], [address], [place], [place] [address]    Culture   Final    NO GROWTH 1 DAY Performed at [facility], [address], [place], [place] [address]    Report Status PENDING  Incomplete  Culture, blood (Routine X 2) w Reflex to ID Panel     Status: None (Preliminary result)   Collection Time: 05/11/23 12:30 AM   Specimen: BLOOD RIGHT ARM  Result Value Ref Range Status   Specimen Description   Final    BLOOD RIGHT ARM Performed at [facility], [address], [place], [place] [address]    Special Requests   Final    Blood Culture adequate volume BOTTLES DRAWN AEROBIC AND ANAEROBIC Performed at [facility], [address] [place], [place] [address]    Culture   Final    NO GROWTH 1 DAY Performed at [facility], [address] [place], [place] [address]    Report Status PENDING  Incomplete         Radiology Studies: CT ABDOMEN PELVIS W CONTRAST  Result Date: 05/10/2023 CLINICAL DATA:  Abdominal pain, nausea and vomiting, diarrhea for 2 days, abdominal distension EXAM: CT ANGIOGRAPHY CHEST CT ABDOMEN AND PELVIS WITH CONTRAST TECHNIQUE: Multidetector CT imaging of  the chest was performed using the standard protocol during bolus administration of intravenous contrast. Multiplanar CT image reconstructions and MIPs were obtained to evaluate the vascular anatomy. Multidetector CT imaging of the abdomen and pelvis was performed using the standard protocol during bolus administration of intravenous contrast. RADIATION DOSE REDUCTION: This exam was performed according to the departmental dose-optimization program which includes automated exposure control, adjustment of the mA and/or kV according to patient size and/or use of iterative reconstruction technique. CONTRAST:  OMNIPAQUE IOHEXOL 350 MG/ML SOLN COMPARISON:  11/26/2021 FINDINGS: CTA CHEST FINDINGS Cardiovascular: This is a technically adequate evaluation of the pulmonary vasculature. No filling defects or pulmonary emboli. Prominent left ventricular dilatation again noted. No pericardial effusion. Ascending thoracic aortic aneurysm measuring up to 4.1 cm. No evidence of dissection. Atherosclerosis of the aorta and coronary vasculature. Mediastinum/Nodes: No enlarged mediastinal, hilar, or axillary lymph nodes. Thyroid gland, trachea, and esophagus demonstrate no significant findings. Lungs/Pleura: No acute airspace disease, effusion, or pneumothorax. Central airways are patent. Musculoskeletal: No acute or destructive bony abnormalities. Reconstructed images demonstrate no additional findings. Review of the MIP images confirms the above findings. CT ABDOMEN and PELVIS FINDINGS Hepatobiliary: Since the prior exam, numerous large hypodense masses have replace the majority of the liver parenchyma, consistent with progressive primary malignancy or worsening metastatic disease. Largest mass occupying the majority of the right lobe liver image 45/2 measures 22.4 x 16.6 cm. Gallbladder is unremarkable. No biliary duct dilation. Pancreas: Unremarkable. No pancreatic ductal dilatation or surrounding inflammatory changes.  Spleen: Normal in size without focal abnormality. Adrenals/Urinary Tract: There is a large soft tissue mass in the left suprarenal fossa, which may be associated with the lateral limb of the left adrenal gland, measuring up to 7.2 x 4.8 cm reference image 20/2, consistent with metastatic disease. The right adrenal appears unremarkable. There is a solid mass within the upper pole right kidney measuring 3.1 x 2.7 cm, concerning for renal cell carcinoma. This is not changed significantly in size since prior study. There are numerous other hypodensity seen throughout the bilateral renal parenchyma, most too small to characterize accurately. There are bilateral nonobstructing renal calculi, measuring up to  9 mm on the right and 11 mm on the left. No obstructive uropathy within either kidney. Chronic bladder wall thickening, trabeculations, and multiple diverticula consistent with chronic bladder outlet obstruction. Stomach/Bowel: There is a large amount of stool within the rectosigmoid colon, consistent with constipation. A large amount of stool in the rectal vault may reflect fecal impaction. No evidence of bowel obstruction or ileus. There is marked distension of the stomach. This may be due to extrinsic compression upon the proximal duodenum by the enlarging hepatic masses described above. Decompression with enteric catheter may be useful. Vascular/Lymphatic: Aortic atherosclerosis. No discrete adenopathy identified within the abdomen or pelvis. Reproductive: The prostate is not enlarged. Other: There is no free fluid or free intraperitoneal gas. No abdominal wall hernia. Musculoskeletal: Progressive decubitus ulceration overlying the sacrum and coccyx. Underlying bony erosion and sclerosis consistent with osteomyelitis, likely chronic. There are no acute displaced fractures. Severe degenerative changes of the lumbar spine and bilateral hips. Reconstructed images demonstrate no additional findings. Review of the MIP  images confirms the above findings. IMPRESSION: Chest: 1. No evidence of pulmonary embolus. 2. No acute intrathoracic process. 3. 4.1 cm ascending thoracic aortic aneurysm. Recommend annual imaging followup by CTA or MRA. This recommendation follows 2010 ACCF/AHA/AATS/ACR/ASA/SCA/SCAI/SIR/STS/SVM Guidelines for the Diagnosis and Management of Patients with Thoracic Aortic Disease. Circulation. 2010; 121: V784-O962. Aortic aneurysm NOS (ICD10-I71.9) Abdomen/pelvis: 1. New and enlarging masses throughout the liver, consistent with progressive metastatic disease or worsening primary liver malignancy since prior exam. 2. Mass effect on the proximal duodenum by the enlarging right lobe liver mass described above, with marked distension of the stomach. Decompression of the stomach with enteric catheter may be useful. 3. 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 4. Solid mass upper pole right kidney suspicious for renal cell carcinoma. Numerous other hypodensities throughout the liver parenchyma are too small to characterize. 5. Sacral decubitus ulcer, with evidence of underlying chronic osteomyelitis. 6. Large amount of retained stool within the distal colon consistent with constipation, with stool ball in the rectal vault concerning for fecal impaction. No bowel obstruction. 7. Bilateral nonobstructing renal calculi. 8.  Aortic Atherosclerosis (ICD10-I70.0). 9. Chronic bladder wall thickening, trabeculations, and diverticula consistent with chronic bladder outlet obstruction. Electronically Signed   By: Sharlet Salina M.D.   On: 05/10/2023 18:12   CT Angio Chest PE W and/or Wo Contrast  Result Date: 05/10/2023 CLINICAL DATA:  Abdominal pain, nausea and vomiting, diarrhea for 2 days, abdominal distension EXAM: CT ANGIOGRAPHY CHEST CT ABDOMEN AND PELVIS WITH CONTRAST TECHNIQUE: Multidetector CT imaging of the chest was performed using the standard protocol during bolus  administration of intravenous contrast. Multiplanar CT image reconstructions and MIPs were obtained to evaluate the vascular anatomy. Multidetector CT imaging of the abdomen and pelvis was performed using the standard protocol during bolus administration of intravenous contrast. RADIATION DOSE REDUCTION: This exam was performed according to the departmental dose-optimization program which includes automated exposure control, adjustment of the mA and/or kV according to patient size and/or use of iterative reconstruction technique. CONTRAST:  OMNIPAQUE IOHEXOL 350 MG/ML SOLN COMPARISON:  11/26/2021 FINDINGS: CTA CHEST FINDINGS Cardiovascular: This is a technically adequate evaluation of the pulmonary vasculature. No filling defects or pulmonary emboli. Prominent left ventricular dilatation again noted. No pericardial effusion. Ascending thoracic aortic aneurysm measuring up to 4.1 cm. No evidence of dissection. Atherosclerosis of the aorta and coronary vasculature. Mediastinum/Nodes: No enlarged mediastinal, hilar, or axillary lymph nodes. Thyroid gland, trachea,  and esophagus demonstrate no significant findings. Lungs/Pleura: No acute airspace disease, effusion, or pneumothorax. Central airways are patent. Musculoskeletal: No acute or destructive bony abnormalities. Reconstructed images demonstrate no additional findings. Review of the MIP images confirms the above findings. CT ABDOMEN and PELVIS FINDINGS Hepatobiliary: Since the prior exam, numerous large hypodense masses have replace the majority of the liver parenchyma, consistent with progressive primary malignancy or worsening metastatic disease. Largest mass occupying the majority of the right lobe liver image 45/2 measures 22.4 x 16.6 cm. Gallbladder is unremarkable. No biliary duct dilation. Pancreas: Unremarkable. No pancreatic ductal dilatation or surrounding inflammatory changes. Spleen: Normal in size without focal abnormality. Adrenals/Urinary  Tract: There is a large soft tissue mass in the left suprarenal fossa, which may be associated with the lateral limb of the left adrenal gland, measuring up to 7.2 x 4.8 cm reference image 20/2, consistent with metastatic disease. The right adrenal appears unremarkable. There is a solid mass within the upper pole right kidney measuring 3.1 x 2.7 cm, concerning for renal cell carcinoma. This is not changed significantly in size since prior study. There are numerous other hypodensity seen throughout the bilateral renal parenchyma, most too small to characterize accurately. There are bilateral nonobstructing renal calculi, measuring up to 9 mm on the right and 11 mm on the left. No obstructive uropathy within either kidney. Chronic bladder wall thickening, trabeculations, and multiple diverticula consistent with chronic bladder outlet obstruction. Stomach/Bowel: There is a large amount of stool within the rectosigmoid colon, consistent with constipation. A large amount of stool in the rectal vault may reflect fecal impaction. No evidence of bowel obstruction or ileus. There is marked distension of the stomach. This may be due to extrinsic compression upon the proximal duodenum by the enlarging hepatic masses described above. Decompression with enteric catheter may be useful. Vascular/Lymphatic: Aortic atherosclerosis. No discrete adenopathy identified within the abdomen or pelvis. Reproductive: The prostate is not enlarged. Other: There is no free fluid or free intraperitoneal gas. No abdominal wall hernia. Musculoskeletal: Progressive decubitus ulceration overlying the sacrum and coccyx. Underlying bony erosion and sclerosis consistent with osteomyelitis, likely chronic. There are no acute displaced fractures. Severe degenerative changes of the lumbar spine and bilateral hips. Reconstructed images demonstrate no additional findings. Review of the MIP images confirms the above findings. IMPRESSION: Chest: 1. No  evidence of pulmonary embolus. 2. No acute intrathoracic process. 3. 4.1 cm ascending thoracic aortic aneurysm. Recommend annual imaging followup by CTA or MRA. This recommendation follows 2010 ACCF/AHA/AATS/ACR/ASA/SCA/SCAI/SIR/STS/SVM Guidelines for the Diagnosis and Management of Patients with Thoracic Aortic Disease. Circulation. 2010; 121: [id number]. Aortic aneurysm NOS (ICD10-I71.9) Abdomen/pelvis: 1. New and enlarging masses throughout the liver, consistent with progressive metastatic disease or worsening primary liver malignancy since prior exam. 2. Mass effect on the proximal duodenum by the enlarging right lobe liver mass described above, with marked distension of the stomach. Decompression of the stomach with enteric catheter may be useful. 3. 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 4. Solid mass upper pole right kidney suspicious for renal cell carcinoma. Numerous other hypodensities throughout the liver parenchyma are too small to characterize. 5. Sacral decubitus ulcer, with evidence of underlying chronic osteomyelitis. 6. Large amount of retained stool within the distal colon consistent with constipation, with stool ball in the rectal vault concerning for fecal impaction. No bowel obstruction. 7. Bilateral nonobstructing renal calculi. 8.  Aortic Atherosclerosis (ICD10-I70.0). 9. Chronic bladder wall thickening, trabeculations, and diverticula consistent with  chronic bladder outlet obstruction. Electronically Signed   By: Sharlet Salina M.D.   On: 05/10/2023 18:12        Scheduled Meds:  acetaminophen  1,000 mg Oral Once   dexamethasone (DECADRON) injection  8 mg Intravenous Q0600   feeding supplement  237 mL Oral BID BM   leptospermum manuka honey  1 Application Topical Daily   metoCLOPramide (REGLAN) injection  5 mg Intravenous Q8H   metoprolol tartrate  50 mg Oral BID   pantoprazole (PROTONIX) IV  40 mg Intravenous Q12H    polyethylene glycol  17 g Oral Daily   rifaximin  550 mg Oral TID   senna  2 tablet Oral BID AC & HS   sodium chloride flush  10-40 mL Intracatheter Q12H   sucralfate  1 g Oral TID WC & HS   Continuous Infusions:  cefTRIAXone (ROCEPHIN)  IV Stopped (05/11/23 1700)     LOS: 1 day    Time spent: 35 minutes.    Dorcas Carrow, MD [facility]

## 2023-05-13 DIAGNOSIS — C229 Malignant neoplasm of liver, not specified as primary or secondary: Secondary | ICD-10-CM

## 2023-05-13 DIAGNOSIS — R112 Nausea with vomiting, unspecified: Secondary | ICD-10-CM | POA: Diagnosis not present

## 2023-05-13 DIAGNOSIS — Z515 Encounter for palliative care: Secondary | ICD-10-CM | POA: Diagnosis not present

## 2023-05-13 DIAGNOSIS — L89154 Pressure ulcer of sacral region, stage 4: Secondary | ICD-10-CM | POA: Diagnosis not present

## 2023-05-13 LAB — GLUCOSE, CAPILLARY: Glucose-Capillary: 118 mg/dL — ABNORMAL HIGH (ref 70–99)

## 2023-05-13 MED ORDER — POLYETHYLENE GLYCOL 3350 17 G PO PACK: 17.0000 g | PACK | Freq: Every day | ORAL | 0 refills | Status: AC | PRN

## 2023-05-13 MED ORDER — OXYCODONE HCL ER 10 MG PO T12A: 10.0000 mg | EXTENDED_RELEASE_TABLET | Freq: Two times a day (BID) | ORAL | 0 refills | Status: AC

## 2023-05-13 MED ORDER — OXYCODONE-ACETAMINOPHEN 7.5-325 MG PO TABS
1.0000 | ORAL_TABLET | ORAL | Status: DC | PRN
  Administered 2023-05-13: 1 via ORAL
  Filled 2023-05-13: qty 1

## 2023-05-13 MED ORDER — OXYCODONE HCL ER 10 MG PO T12A
10.0000 mg | EXTENDED_RELEASE_TABLET | Freq: Two times a day (BID) | ORAL | Status: DC
  Administered 2023-05-13: 10 mg via ORAL
  Filled 2023-05-13: qty 1

## 2023-05-13 MED ORDER — METOCLOPRAMIDE HCL 10 MG/10ML PO SOLN
10.0000 mg | Freq: Three times a day (TID) | ORAL | Status: DC
  Administered 2023-05-13: 10 mg via ORAL
  Filled 2023-05-13 (×2): qty 10

## 2023-05-13 MED ORDER — ONDANSETRON HCL 4 MG PO TABS: 4.0000 mg | ORAL_TABLET | Freq: Four times a day (QID) | ORAL | 0 refills | Status: AC | PRN

## 2023-05-13 MED ORDER — DEXAMETHASONE 4 MG PO TABS: 4.0000 mg | ORAL_TABLET | Freq: Every day | ORAL | 0 refills | Status: AC

## 2023-05-13 MED ORDER — DEXAMETHASONE 4 MG PO TABS: 4.0000 mg | ORAL_TABLET | Freq: Every day | ORAL | Status: DC

## 2023-05-13 MED ORDER — POLYVINYL ALCOHOL 1.4 % OP SOLN: 1.0000 [drp] | Freq: Four times a day (QID) | OPHTHALMIC | 0 refills | Status: AC | PRN

## 2023-05-13 MED ORDER — NITROFURANTOIN MONOHYD MACRO 100 MG PO CAPS: 100.0000 mg | ORAL_CAPSULE | Freq: Two times a day (BID) | ORAL | 0 refills | Status: AC

## 2023-05-13 MED ORDER — OXYCODONE-ACETAMINOPHEN 7.5-325 MG PO TABS: 1.0000 | ORAL_TABLET | ORAL | 0 refills | Status: AC | PRN

## 2023-05-13 MED ORDER — SUCRALFATE 1 GM/10ML PO SUSP: 1.0000 g | Freq: Three times a day (TID) | ORAL | 0 refills | Status: AC

## 2023-05-13 MED ORDER — NITROFURANTOIN MONOHYD MACRO 100 MG PO CAPS
100.0000 mg | ORAL_CAPSULE | Freq: Two times a day (BID) | ORAL | Status: DC
  Administered 2023-05-13: 100 mg via ORAL
  Filled 2023-05-13 (×2): qty 1

## 2023-05-13 MED ORDER — PANTOPRAZOLE SODIUM 40 MG PO TBEC: 40.0000 mg | DELAYED_RELEASE_TABLET | Freq: Every day | ORAL | Status: DC

## 2023-05-13 MED ORDER — LORAZEPAM 2 MG/ML PO CONC: 0.6000 mg | ORAL | 0 refills | Status: AC | PRN

## 2023-05-13 NOTE — Discharge Summary (Signed)
Physician Discharge Summary  Jeffrey Fields NWG:[id number] DOB: 1956-07-02 DOA: 05/10/2023  PCP: Verdell Face, DO  Admit date: 05/10/2023 Discharge date: 05/13/2023  Admitted From: Home Disposition: Home with home hospice  Recommendations for Outpatient Follow-up:  As per hospice provide  Discharge Condition: Poor CODE STATUS: DNR Diet recommendation: As tolerated  Discharge summary: 67 year old with history of spinal stenosis and bedbound status, right above-knee amputation, type 2 diabetes, hypertension, chronic decubitus ulcer and chronic osteomyelitis of the sacrum presented the hospital with vomiting and abdominal distention.  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  Oncology, palliative care and IR were consulted.  Patient appropriately declined to have biopsy or further treatment.  Patient is still in the hospital for symptom control and pain management.  Currently symptoms improved.  Going home with home hospice in place.       Intractable nausea and vomiting due to gastric outlet obstruction from newly discovered metastatic disease, unknown primary.  Multiple metastatic lesion in the liver. Failure to thrive. Severe electrolyte abnormalities Chronic osteomyelitis, bed bound Ischemic cardiomyopathy, paroxysmal atrial fibrillation and coronary artery disease,   With his overall poor clinical status and nearing end-of-life, he was seen and followed by oncology and palliative care.  Patient declined to have liver biopsy as it is not going to change the trajectory of his disease. Patient was treated with various pain medications as well as Dilaudid PCA. Converted to comfort care 8/4. Symptom control achieved with oral medications today. Patient will be going home with 24-hour support and taken care of by [facility] [place]. Discharge medications, Ativan 0.6 mg every 4 hours as needed for anxiety  nausea Percocet 7.5 mg every 4 hours for pain OxyContin 10 mg twice daily as a scheduled doses Nausea medications. Mixed organism in the urine, unknown significance.  Received Rocephin.  Will treat with nitrofurantoin for 7 additional days.  Stable for discharge home with hospice level of care.  Discharge Diagnoses:  Principal Problem:   Intractable nausea and vomiting Active Problems:   Metastatic disease (HCC)   chronic Osteomyelitis (HCC)   Hypokalemia   Chronic anemia   Paroxysmal atrial fibrillation (HCC)   SIRS (systemic inflammatory response syndrome) (HCC)   Ischemic cardiomyopathy   CAD S/P percutaneous coronary angioplasty   Essential hypertension   Type 2 diabetes mellitus (HCC)   Hyperlipidemia   S/P AKA (above knee amputation), right (HCC)   Ascending aortic aneurysm (HCC)   Spinal stenosis with paraplegia   Palliative care encounter   Fecal impaction (HCC)   Pressure injury of sacral region, stage 4 (HCC)   Goals of care, counseling/discussion   DNR (do not resuscitate)   Cancer associated pain   High risk medication use   Nausea and vomiting   Counseling and coordination of care   Malignant neoplasm of liver [facility])    Discharge Instructions  Discharge Instructions     Diet clear liquid   Complete by: As directed    Discharge wound care:   Complete by: As directed    Wound care to stage 3 pressure injury (chronic, nonhealing, POA):  Cleanse with NS, pat dry. Apply Medihoney to wound, fill with saline dampened gauze   Increase activity slowly   Complete by: As directed       Allergies as of 05/13/2023   No Known Allergies      Medication List     STOP taking these medications    amLODipine 10  MG tablet Commonly known as: NORVASC   aspirin EC 81 MG tablet   clopidogrel 75 MG tablet Commonly known as: PLAVIX   collagenase 250 UNIT/GM ointment Commonly known as: SANTYL   docusate sodium 100 MG capsule Commonly known as: COLACE    furosemide 20 MG tablet Commonly known as: LASIX   metFORMIN 500 MG tablet Commonly known as: GLUCOPHAGE   rosuvastatin 10 MG tablet Commonly known as: CRESTOR       TAKE these medications    acetaminophen 500 MG tablet Commonly known as: TYLENOL Take 500 mg by mouth every 6 (six) hours as needed for moderate pain. What changed: Another medication with the same name was removed. Continue taking this medication, and follow the directions you see here.   dexamethasone 4 MG tablet Commonly known as: DECADRON Take 1 tablet (4 mg total) by mouth daily for 14 days. Start taking on: May 14, 2023   LORazepam 2 MG/ML concentrated solution Commonly known as: ATIVAN Take 0.3 mLs (0.6 mg total) by mouth every 4 (four) hours as needed for anxiety (nausea).   metoprolol tartrate 100 MG tablet Commonly known as: LOPRESSOR Take 100 mg by mouth 2 (two) times daily.   naloxone 0.4 MG/ML injection Commonly known as: NARCAN Inject 0.4 mg into the vein as needed.   nitrofurantoin (macrocrystal-monohydrate) 100 MG capsule Commonly known as: MACROBID Take 1 capsule (100 mg total) by mouth every 12 (twelve) hours for 7 days.   nitroGLYCERIN 0.4 MG SL tablet Commonly known as: NITROSTAT Place 0.4 mg under the tongue every 5 (five) minutes as needed for chest pain.   ondansetron 4 MG tablet Commonly known as: ZOFRAN Take 1 tablet (4 mg total) by mouth every 6 (six) hours as needed for nausea.   oxyCODONE 10 mg 12 hr tablet Commonly known as: OXYCONTIN Take 1 tablet (10 mg total) by mouth every 12 (twelve) hours.   oxyCODONE-acetaminophen 7.5-325 MG tablet Commonly known as: PERCOCET Take 1 tablet by mouth every 4 (four) hours as needed for moderate pain. What changed: when to take this   pantoprazole 40 MG tablet Commonly known as: PROTONIX Take 1 tablet (40 mg total) by mouth daily.   polyethylene glycol 17 g packet Commonly known as: MIRALAX / GLYCOLAX Take 17 g by mouth  daily as needed for mild constipation.   polyvinyl alcohol 1.4 % ophthalmic solution Commonly known as: LIQUIFILM TEARS Place 1 drop into both eyes 4 (four) times daily as needed for dry eyes.   sucralfate 1 GM/10ML suspension Commonly known as: CARAFATE Take 10 mLs (1 g total) by mouth 4 (four) times daily -  with meals and at bedtime.               Discharge Care Instructions  (From admission, onward)           Start     Ordered   05/13/23 0000  Discharge wound care:       Comments: Wound care to stage 3 pressure injury (chronic, nonhealing, POA):  Cleanse with NS, pat dry. Apply Medihoney to wound, fill with saline dampened gauze   05/13/23 1401            No Known Allergies  Consultations: Oncology Radiology Palliative care   Procedures/Studies: CT ABDOMEN PELVIS W CONTRAST  Result Date: 05/10/2023 CLINICAL DATA:  Abdominal pain, nausea and vomiting, diarrhea for 2 days, abdominal distension EXAM: CT ANGIOGRAPHY CHEST CT ABDOMEN AND PELVIS WITH CONTRAST TECHNIQUE: Multidetector CT imaging of the  chest was performed using the standard protocol during bolus administration of intravenous contrast. Multiplanar CT image reconstructions and MIPs were obtained to evaluate the vascular anatomy. Multidetector CT imaging of the abdomen and pelvis was performed using the standard protocol during bolus administration of intravenous contrast. RADIATION DOSE REDUCTION: This exam was performed according to the departmental dose-optimization program which includes automated exposure control, adjustment of the mA and/or kV according to patient size and/or use of iterative reconstruction technique. CONTRAST:  OMNIPAQUE IOHEXOL 350 MG/ML SOLN COMPARISON:  11/26/2021 FINDINGS: CTA CHEST FINDINGS Cardiovascular: This is a technically adequate evaluation of the pulmonary vasculature. No filling defects or pulmonary emboli. Prominent left ventricular dilatation again noted. No  pericardial effusion. Ascending thoracic aortic aneurysm measuring up to 4.1 cm. No evidence of dissection. Atherosclerosis of the aorta and coronary vasculature. Mediastinum/Nodes: No enlarged mediastinal, hilar, or axillary lymph nodes. Thyroid gland, trachea, and esophagus demonstrate no significant findings. Lungs/Pleura: No acute airspace disease, effusion, or pneumothorax. Central airways are patent. Musculoskeletal: No acute or destructive bony abnormalities. Reconstructed images demonstrate no additional findings. Review of the MIP images confirms the above findings. CT ABDOMEN and PELVIS FINDINGS Hepatobiliary: Since the prior exam, numerous large hypodense masses have replace the majority of the liver parenchyma, consistent with progressive primary malignancy or worsening metastatic disease. Largest mass occupying the majority of the right lobe liver image 45/2 measures 22.4 x 16.6 cm. Gallbladder is unremarkable. No biliary duct dilation. Pancreas: Unremarkable. No pancreatic ductal dilatation or surrounding inflammatory changes. Spleen: Normal in size without focal abnormality. Adrenals/Urinary Tract: There is a large soft tissue mass in the left suprarenal fossa, which may be associated with the lateral limb of the left adrenal gland, measuring up to 7.2 x 4.8 cm reference image 20/2, consistent with metastatic disease. The right adrenal appears unremarkable. There is a solid mass within the upper pole right kidney measuring 3.1 x 2.7 cm, concerning for renal cell carcinoma. This is not changed significantly in size since prior study. There are numerous other hypodensity seen throughout the bilateral renal parenchyma, most too small to characterize accurately. There are bilateral nonobstructing renal calculi, measuring up to 9 mm on the right and 11 mm on the left. No obstructive uropathy within either kidney. Chronic bladder wall thickening, trabeculations, and multiple diverticula consistent with  chronic bladder outlet obstruction. Stomach/Bowel: There is a large amount of stool within the rectosigmoid colon, consistent with constipation. A large amount of stool in the rectal vault may reflect fecal impaction. No evidence of bowel obstruction or ileus. There is marked distension of the stomach. This may be due to extrinsic compression upon the proximal duodenum by the enlarging hepatic masses described above. Decompression with enteric catheter may be useful. Vascular/Lymphatic: Aortic atherosclerosis. No discrete adenopathy identified within the abdomen or pelvis. Reproductive: The prostate is not enlarged. Other: There is no free fluid or free intraperitoneal gas. No abdominal wall hernia. Musculoskeletal: Progressive decubitus ulceration overlying the sacrum and coccyx. Underlying bony erosion and sclerosis consistent with osteomyelitis, likely chronic. There are no acute displaced fractures. Severe degenerative changes of the lumbar spine and bilateral hips. Reconstructed images demonstrate no additional findings. Review of the MIP images confirms the above findings. IMPRESSION: Chest: 1. No evidence of pulmonary embolus. 2. No acute intrathoracic process. 3. 4.1 cm ascending thoracic aortic aneurysm. Recommend annual imaging followup by CTA or MRA. This recommendation follows 2010 ACCF/AHA/AATS/ACR/ASA/SCA/SCAI/SIR/STS/SVM Guidelines for the Diagnosis and Management of Patients with Thoracic Aortic Disease. Circulation. 2010; 121: U725-D664. Aortic  aneurysm NOS (ICD10-I71.9) Abdomen/pelvis: 1. New and enlarging masses throughout the liver, consistent with progressive metastatic disease or worsening primary liver malignancy since prior exam. 2. Mass effect on the proximal duodenum by the enlarging right lobe liver mass described above, with marked distension of the stomach. Decompression of the stomach with enteric catheter may be useful. 3. Large soft tissue masses within the left suprarenal region,  likely due to adrenal metastases. 4. Solid mass upper pole right kidney suspicious for renal cell carcinoma. Numerous other hypodensities throughout the liver parenchyma are too small to characterize. 5. Sacral decubitus ulcer, with evidence of underlying chronic osteomyelitis. 6. Large amount of retained stool within the distal colon consistent with constipation, with stool ball in the rectal vault concerning for fecal impaction. No bowel obstruction. 7. Bilateral nonobstructing renal calculi. 8.  Aortic Atherosclerosis (ICD10-I70.0). 9. Chronic bladder wall thickening, trabeculations, and diverticula consistent with chronic bladder outlet obstruction. Electronically Signed   By: Sharlet Salina M.D.   On: 05/10/2023 18:12   CT Angio Chest PE W and/or Wo Contrast  Result Date: 05/10/2023 CLINICAL DATA:  Abdominal pain, nausea and vomiting, diarrhea for 2 days, abdominal distension EXAM: CT ANGIOGRAPHY CHEST CT ABDOMEN AND PELVIS WITH CONTRAST TECHNIQUE: Multidetector CT imaging of the chest was performed using the standard protocol during bolus administration of intravenous contrast. Multiplanar CT image reconstructions and MIPs were obtained to evaluate the vascular anatomy. Multidetector CT imaging of the abdomen and pelvis was performed using the standard protocol during bolus administration of intravenous contrast. RADIATION DOSE REDUCTION: This exam was performed according to the departmental dose-optimization program which includes automated exposure control, adjustment of the mA and/or kV according to patient size and/or use of iterative reconstruction technique. CONTRAST:  OMNIPAQUE IOHEXOL 350 MG/ML SOLN COMPARISON:  11/26/2021 FINDINGS: CTA CHEST FINDINGS Cardiovascular: This is a technically adequate evaluation of the pulmonary vasculature. No filling defects or pulmonary emboli. Prominent left ventricular dilatation again noted. No pericardial effusion. Ascending thoracic aortic aneurysm  measuring up to 4.1 cm. No evidence of dissection. Atherosclerosis of the aorta and coronary vasculature. Mediastinum/Nodes: No enlarged mediastinal, hilar, or axillary lymph nodes. Thyroid gland, trachea, and esophagus demonstrate no significant findings. Lungs/Pleura: No acute airspace disease, effusion, or pneumothorax. Central airways are patent. Musculoskeletal: No acute or destructive bony abnormalities. Reconstructed images demonstrate no additional findings. Review of the MIP images confirms the above findings. CT ABDOMEN and PELVIS FINDINGS Hepatobiliary: Since the prior exam, numerous large hypodense masses have replace the majority of the liver parenchyma, consistent with progressive primary malignancy or worsening metastatic disease. Largest mass occupying the majority of the right lobe liver image 45/2 measures 22.4 x 16.6 cm. Gallbladder is unremarkable. No biliary duct dilation. Pancreas: Unremarkable. No pancreatic ductal dilatation or surrounding inflammatory changes. Spleen: Normal in size without focal abnormality. Adrenals/Urinary Tract: There is a large soft tissue mass in the left suprarenal fossa, which may be associated with the lateral limb of the left adrenal gland, measuring up to 7.2 x 4.8 cm reference image 20/2, consistent with metastatic disease. The right adrenal appears unremarkable. There is a solid mass within the upper pole right kidney measuring 3.1 x 2.7 cm, concerning for renal cell carcinoma. This is not changed significantly in size since prior study. There are numerous other hypodensity seen throughout the bilateral renal parenchyma, most too small to characterize accurately. There are bilateral nonobstructing renal calculi, measuring up to 9 mm on the right and 11 mm on the left. No obstructive uropathy within  either kidney. Chronic bladder wall thickening, trabeculations, and multiple diverticula consistent with chronic bladder outlet obstruction. Stomach/Bowel: There is a  large amount of stool within the rectosigmoid colon, consistent with constipation. A large amount of stool in the rectal vault may reflect fecal impaction. No evidence of bowel obstruction or ileus. There is marked distension of the stomach. This may be due to extrinsic compression upon the proximal duodenum by the enlarging hepatic masses described above. Decompression with enteric catheter may be useful. Vascular/Lymphatic: Aortic atherosclerosis. No discrete adenopathy identified within the abdomen or pelvis. Reproductive: The prostate is not enlarged. Other: There is no free fluid or free intraperitoneal gas. No abdominal wall hernia. Musculoskeletal: Progressive decubitus ulceration overlying the sacrum and coccyx. Underlying bony erosion and sclerosis consistent with osteomyelitis, likely chronic. There are no acute displaced fractures. Severe degenerative changes of the lumbar spine and bilateral hips. Reconstructed images demonstrate no additional findings. Review of the MIP images confirms the above findings. IMPRESSION: Chest: 1. No evidence of pulmonary embolus. 2. No acute intrathoracic process. 3. 4.1 cm ascending thoracic aortic aneurysm. Recommend annual imaging followup by CTA or MRA. This recommendation follows 2010 ACCF/AHA/AATS/ACR/ASA/SCA/SCAI/SIR/STS/SVM Guidelines for the Diagnosis and Management of Patients with Thoracic Aortic Disease. Circulation. 2010; 121: B284-X324. Aortic aneurysm NOS (ICD10-I71.9) Abdomen/pelvis: 1. New and enlarging masses throughout the liver, consistent with progressive metastatic disease or worsening primary liver malignancy since prior exam. 2. Mass effect on the proximal duodenum by the enlarging right lobe liver mass described above, with marked distension of the stomach. Decompression of the stomach with enteric catheter may be useful. 3. 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 4. Solid mass upper pole right  kidney suspicious for renal cell carcinoma. Numerous other hypodensities throughout the liver parenchyma are too small to characterize. 5. Sacral decubitus ulcer, with evidence of underlying chronic osteomyelitis. 6. Large amount of retained stool within the distal colon consistent with constipation, with stool ball in the rectal vault concerning for fecal impaction. No bowel obstruction. 7. Bilateral nonobstructing renal calculi. 8.  Aortic Atherosclerosis (ICD10-I70.0). 9. Chronic bladder wall thickening, trabeculations, and diverticula consistent with chronic bladder outlet obstruction. Electronically Signed   By: Sharlet Salina M.D.   On: 05/10/2023 18:12   (Echo, Carotid, EGD, Colonoscopy, ERCP)    Subjective: Patient seen and examined in the morning rounds.  He denies any complaints.  Patient tells me that he is ready to go home.  He understands he is going home with hospice.  He was worried about pain medications.  He thinks Percocet works best.   Discharge Exam: Vitals:   05/12/23 2000 05/13/23 0400  BP:  (!) 132/90  Pulse:  95  Resp:  16  Temp:  98.1 F (36.7 C)  SpO2: (S) (!) 0% 100%   Vitals:   05/12/23 0516 05/12/23 0655 05/12/23 2000 05/13/23 0400  BP:  (!) 151/92  (!) 132/90  Pulse:  (!) 108  95  Resp: 15 18  16   Temp:  97.9 F (36.6 C)  98.1 F (36.7 C)  TempSrc:  Oral  Oral  SpO2: 98% 100% (S) (!) 0% 100%  Weight:      Height:        Sick looking, elderly, lying in bed.  Currently not in any distress.  On room air.    The results of significant diagnostics from this hospitalization (including imaging, microbiology, ancillary and laboratory) are listed below for reference.     Microbiology:  Recent Results (from the past 240 hour(s))  Urine Culture (for pregnant, neutropenic or urologic patients or patients with an indwelling urinary catheter)     Status: Abnormal   Collection Time: 05/10/23  3:30 PM   Specimen: Urine, Clean Catch  Result Value Ref Range  Status   Specimen Description   Final    URINE, CLEAN CATCH Performed at [facility], [address], [place], [place] [address]    Special Requests   Final    NONE Performed at [facility], [address], [place], [place] [address]    Culture (A)  Final    >=100,000 COLONIES/mL CITROBACTER FREUNDII >=100,000 COLONIES/mL STAPHYLOCOCCUS HAEMOLYTICUS    Report Status 05/13/2023 FINAL  Final   Organism ID, Bacteria CITROBACTER FREUNDII (A)  Final   Organism ID, Bacteria STAPHYLOCOCCUS HAEMOLYTICUS (A)  Final      Susceptibility   Citrobacter freundii - MIC*    CEFTRIAXONE >=64 RESISTANT Resistant     CIPROFLOXACIN >=4 RESISTANT Resistant     GENTAMICIN <=1 SENSITIVE Sensitive     IMIPENEM 1 SENSITIVE Sensitive     NITROFURANTOIN <=16 SENSITIVE Sensitive     TRIMETH/SULFA <=20 SENSITIVE Sensitive     * >=100,000 COLONIES/mL CITROBACTER FREUNDII   Staphylococcus haemolyticus - MIC*    CIPROFLOXACIN >=8 RESISTANT Resistant     GENTAMICIN 2 SENSITIVE Sensitive     NITROFURANTOIN <=16 SENSITIVE Sensitive     OXACILLIN >=4 RESISTANT Resistant     TETRACYCLINE <=1 SENSITIVE Sensitive     VANCOMYCIN <=0.5 SENSITIVE Sensitive     TRIMETH/SULFA 160 RESISTANT Resistant     CLINDAMYCIN <=0.25 SENSITIVE Sensitive     RIFAMPIN <=0.5 SENSITIVE Sensitive     Inducible Clindamycin NEGATIVE Sensitive     * >=100,000 COLONIES/mL STAPHYLOCOCCUS HAEMOLYTICUS  Culture, blood (Routine X 2) w Reflex to ID Panel     Status: None (Preliminary result)   Collection Time: 05/10/23 10:42 PM   Specimen: BLOOD RIGHT ARM  Result Value Ref Range Status   Specimen Description   Final    BLOOD RIGHT ARM Performed at [facility], [address] [place], [place] [address]    Special Requests   Final    Blood Culture adequate volume BOTTLES DRAWN AEROBIC AND ANAEROBIC Performed at [facility], [address], [place], [place] [address]     Culture   Final    NO GROWTH 2 DAYS Performed at [facility], [address], [place], [place] [address]    Report Status PENDING  Incomplete  Culture, blood (Routine X 2) w Reflex to ID Panel     Status: None (Preliminary result)   Collection Time: 05/11/23 12:30 AM   Specimen: BLOOD RIGHT ARM  Result Value Ref Range Status   Specimen Description   Final    BLOOD RIGHT ARM Performed at [facility], [address], [place], [place] [address]    Special Requests   Final    Blood Culture adequate volume BOTTLES DRAWN AEROBIC AND ANAEROBIC Performed at [facility], [address], [place], [place] [address]    Culture   Final    NO GROWTH 2 DAYS Performed at [facility], [address], [place], [place] [address]    Report Status PENDING  Incomplete     Labs: BNP (last 3 results) No results for input(s): "BNP" in the last 8760 hours. Basic Metabolic Panel: Recent Labs  Lab 05/10/23 1530 05/10/23 1607 05/11/23 0605 05/11/23 1430  NA 140 138 138  --   K 2.4* 2.4* 2.5* 3.1*  CL 87* 87* 93*  --   CO2 37*  --  36*  --   GLUCOSE 164* 159* 102*  --   BUN 27* 23 21  --   CREATININE 0.74 1.10 0.74  --   CALCIUM 8.5*  --  8.0*  --   MG 2.3  --   --   --    Liver Function Tests: Recent Labs  Lab 05/10/23 1530 05/11/23 0605  AST 13* 13*  ALT 12 13  ALKPHOS 109 98  BILITOT 0.9 0.8  PROT 8.5* 7.5  ALBUMIN 2.4* 2.2*   Recent Labs  Lab 05/10/23 1530  LIPASE 28   No results for input(s): "AMMONIA" in the last 168 hours. CBC: Recent Labs  Lab 05/10/23 1530 05/10/23 1607 05/10/23 2059 05/11/23 0605 05/11/23 1100  WBC 13.5*  --  13.2* 13.0* 14.6*  NEUTROABS 11.6*  --   --   --   --   HGB 7.6* 8.2* 6.1* 8.3* 8.6*  HCT 27.1* 24.0* 21.6* 27.9* 29.1*  MCV 64.7*  --  65.5* 68.2* 70.8*  PLT 673*  --  557* 542* 461*   Cardiac Enzymes: No results for input(s): "CKTOTAL", "CKMB", "CKMBINDEX", "TROPONINI" in the last 168  hours. BNP: Invalid input(s): "POCBNP" CBG: Recent Labs  Lab 05/10/23 2251 05/11/23 0845 05/11/23 1132 05/11/23 2154 05/12/23 0747  GLUCAP 115* 96 88 118* 105*   D-Dimer No results for input(s): "DDIMER" in the last 72 hours. Hgb A1c Recent Labs    05/11/23 1100  HGBA1C 4.9   Lipid Profile No results for input(s): "CHOL", "HDL", "LDLCALC", "TRIG", "CHOLHDL", "LDLDIRECT" in the last 72 hours. Thyroid function studies No results for input(s): "TSH", "T4TOTAL", "T3FREE", "THYROIDAB" in the last 72 hours.  Invalid input(s): "FREET3" Anemia work up Recent Labs    05/10/23 2100 05/11/23 0605  FERRITIN 50  --   TIBC 169* 189*  IRON 9* 15*   Urinalysis    Component Value Date/Time   COLORURINE AMBER (A) 05/10/2023 1530   APPEARANCEUR CLOUDY (A) 05/10/2023 1530   LABSPEC 1.015 05/10/2023 1530   PHURINE 5.0 05/10/2023 1530   GLUCOSEU NEGATIVE 05/10/2023 1530   HGBUR MODERATE (A) 05/10/2023 1530   BILIRUBINUR NEGATIVE 05/10/2023 1530   KETONESUR 5 (A) 05/10/2023 1530   PROTEINUR 30 (A) 05/10/2023 1530   NITRITE NEGATIVE 05/10/2023 1530   LEUKOCYTESUR LARGE (A) 05/10/2023 1530   Sepsis Labs Recent Labs  Lab 05/10/23 1530 05/10/23 2059 05/11/23 0605 05/11/23 1100  WBC 13.5* 13.2* 13.0* 14.6*   Microbiology Recent Results (from the past 240 hour(s))  Urine Culture (for pregnant, neutropenic or urologic patients or patients with an indwelling urinary catheter)     Status: Abnormal   Collection Time: 05/10/23  3:30 PM   Specimen: Urine, Clean Catch  Result Value Ref Range Status   Specimen Description   Final    URINE, CLEAN CATCH Performed at [facility], [address], [place], [place] [address]    Special Requests   Final    NONE Performed at [facility], [address] [place], [place] [address]    Culture (A)  Final    >=100,000 COLONIES/mL CITROBACTER FREUNDII >=100,000 COLONIES/mL STAPHYLOCOCCUS  HAEMOLYTICUS    Report Status 05/13/2023 FINAL  Final   Organism ID, Bacteria CITROBACTER FREUNDII (A)  Final   Organism ID,  Bacteria STAPHYLOCOCCUS HAEMOLYTICUS (A)  Final      Susceptibility   Citrobacter freundii - MIC*    CEFTRIAXONE >=64 RESISTANT Resistant     CIPROFLOXACIN >=4 RESISTANT Resistant     GENTAMICIN <=1 SENSITIVE Sensitive     IMIPENEM 1 SENSITIVE Sensitive     NITROFURANTOIN <=16 SENSITIVE Sensitive     TRIMETH/SULFA <=20 SENSITIVE Sensitive     * >=100,000 COLONIES/mL CITROBACTER FREUNDII   Staphylococcus haemolyticus - MIC*    CIPROFLOXACIN >=8 RESISTANT Resistant     GENTAMICIN 2 SENSITIVE Sensitive     NITROFURANTOIN <=16 SENSITIVE Sensitive     OXACILLIN >=4 RESISTANT Resistant     TETRACYCLINE <=1 SENSITIVE Sensitive     VANCOMYCIN <=0.5 SENSITIVE Sensitive     TRIMETH/SULFA 160 RESISTANT Resistant     CLINDAMYCIN <=0.25 SENSITIVE Sensitive     RIFAMPIN <=0.5 SENSITIVE Sensitive     Inducible Clindamycin NEGATIVE Sensitive     * >=100,000 COLONIES/mL STAPHYLOCOCCUS HAEMOLYTICUS  Culture, blood (Routine X 2) w Reflex to ID Panel     Status: None (Preliminary result)   Collection Time: 05/10/23 10:42 PM   Specimen: BLOOD RIGHT ARM  Result Value Ref Range Status   Specimen Description   Final    BLOOD RIGHT ARM Performed at [facility], [address], [place], [place] [address]    Special Requests   Final    Blood Culture adequate volume BOTTLES DRAWN AEROBIC AND ANAEROBIC Performed at [facility], [address] [place], [place] [address]    Culture   Final    NO GROWTH 2 DAYS Performed at [facility], [address], [place], [place] [address]    Report Status PENDING  Incomplete  Culture, blood (Routine X 2) w Reflex to ID Panel     Status: None (Preliminary result)   Collection Time: 05/11/23 12:30 AM   Specimen: BLOOD RIGHT ARM  Result Value Ref Range Status   Specimen Description   Final    BLOOD RIGHT  ARM Performed at [facility], [address] [place], [place] [address]    Special Requests   Final    Blood Culture adequate volume BOTTLES DRAWN AEROBIC AND ANAEROBIC Performed at [facility], [address], [place], [place] [address]    Culture   Final    NO GROWTH 2 DAYS Performed at [facility], [address], [place], [place] [address]    Report Status PENDING  Incomplete     Time coordinating discharge:  35 minutes  SIGNED:   Dorcas Carrow, MD  [facility] 05/13/2023, 2:02 PM

## 2023-05-13 NOTE — TOC Transition Note (Signed)
Transition of Care [facility]) - CM/SW Discharge Note   Patient Details  Name: Jeffrey Fields MRN: [id number] Date of Birth: 10-29-1955  [facility] CM/SW Contact:  Harriett Sine, RN Phone Number: [phone]  05/13/2023, 12:20 PM   Clinical Narrative:     Spoke with pt at bedside to discuss d/c to home health with hospice. Pt confirmed address, and states he has 24 hrs of support with significant other, adult children, and 5 days a week with PCS. Pt choose [facility], [place]rie contacted with referral information. Transportation arranged via [employer].   Final next level of care: Home w Hospice Care Barriers to Discharge: No Barriers Identified   Patient Goals and CMS Choice CMS Medicare.gov Compare Post Acute Care list provided to:: Patient Choice offered to / list presented to : Patient  Discharge Placement                         Discharge Plan and Services Additional resources added to the After Visit Summary for   In-house Referral:  ([facility])   Post Acute Care Choice: Hospice                      [facility] Agency: [facility] Date [facility] Agency Contacted: 05/13/23   Representative spoke with at [facility] Agency: Jacklynn Ganong  Social Determinants of Health (SDOH) Interventions SDOH Screenings   Food Insecurity: No Food Insecurity (05/11/2023)  Housing: Low Risk  (05/11/2023)  Transportation Needs: No Transportation Needs (05/11/2023)  Utilities: Not At Risk (05/11/2023)  Tobacco Use: Low Risk  (05/10/2023)     Readmission Risk Interventions     No data to display

## 2023-05-13 NOTE — Progress Notes (Signed)
Spoke with pt at bedside TOC needs identified. Pt given list will notify TOC on hospice choice home health.

## 2023-05-13 NOTE — Progress Notes (Signed)
   This pt was referred to hospice services for home care. I have discussed with the pt hospice care at home and he does confirm goals of comfort care is his wishes. He has a hospital bed and W/C at home already with [facility]. He does not feel he needs additional equipment at this time. He confirms his wish for DNR. I have reviewed his chart and discussed with our MD who does approved the pt for hospice care at home. The pt is in agreement with this as well at d/c.   Norm Parcel RN [phone]

## 2023-05-13 NOTE — Progress Notes (Signed)
Daily Progress Note   Patient Name: Jeffrey Fields       Date: 05/13/2023 DOB: September 15, 1956  Age: 67 y.o. MRN#: [id number] Attending Physician: Dorcas Carrow, MD Primary Care Physician: Verdell Face, DO Admit Date: 05/10/2023 Length of Stay: 2 days  Reason for Consultation/Follow-up: Establishing goals of care and symptom management  Subjective:   CC: Patient symptoms currently improved. Wanting to get home with hospice.  Following up regarding complex medical decision making and symptom management.  Subjective:  Reviewed EMR prior to presenting to bedside.  At time of EMR reviewed, patient had received IV Dilaudid 0.2 mg via PCA x 13 doses.  Patient continues to receive dexamethasone, Reglan, sucralfate, and rifaximin.  Presented to bedside to discuss care with patient.  No family present at bedside.  Patient appears comfortable laying in the bed.  Patient notes that his symptoms feel improved today.  Patient notes he was able to eat a little bit and plans on eating a little bit more.  Patient feels that he is going to have a good bowel movement in 7.  Patient is ready to get home with hospice.  Again reviewed philosophy of hospice is to focus on symptom management at the end of life.  Patient notes he did not need any equipment at home because he already has a hospital bed.  Discussed how hospice would become his care team so he would not need to follow-up with other outpatient providers such as his pain management doctor or PCP.  Patient very glad to hear this.    Discussed with patient's OME requirements, could consider addition of OxyContin 10 mg every 12 hours to assist with pain management.  Patient notes that his Percocet worked well at home for short acting so discussed could continue Percocet while also receiving OxyContin.  Patient in agreement with this plan.  Spent time providing emotional support via active listening. All questions answered at that time.  Thanked patient for allowing  me to visit with him today.  Noted pallor medicine team will continue following with patient's medical journey.  Updated IDT regarding transition hope to get home with hospice, potentially today if able.   Review of Systems Abdominal bloating improved Objective:   Vital Signs:  BP (!) 132/90 (BP Location: Left Arm)   Pulse 95   Temp 98.1 F (36.7 C) (Oral)   Resp 16   Ht 6\' 3"  (1.905 m)   Wt 83.1 kg   SpO2 100%   BMI 22.90 kg/m   Physical Exam: General: pleasant, laying in bed, cachectic, frail, chronically ill-appearing Eyes: No drainage noted HENT: moist mucous membranes Cardiovascular: no increased work of breathing noted, not in respiratory distress Abdomen: distended Extremities: Muscle wasting present in extremities, R AKA Skin: Sacral ulcer (EMR) Neuro: A&Ox4, following commands easily Psych: appropriately answers all questions  Imaging:  I personally reviewed recent imaging.   Assessment & Plan:   Assessment: Patient is a 67 year old male with a past medical history of paraplegia secondary to spinal stenosis, PVD status post AKA, type 2 diabetes mellitus, hypertension, PAF, hyperlipidemia, anemia, CAD, and chronic sacral ulcer with chronic osteomyelitis was admitted on 05/10/2023 for management of emesis.  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  Liver mass is causing a mass effect on the proximal duodenum leading to marked stomach distention.  Imaging also noted stool burden  with possible fecal impaction.  Oncology has been consulted valuation.  Palliative medicine team consulted to assist with complex medical decision making and symptom management.    Recommendations/Plan: # Complex medical decision making/goals of care:      -Patient transitioned to full comfort focused care 8/4.  Continuing antibiotics to hopefully allow more  time with family at home.  Patient wanting to get home with hospice support for symptom management at the end of life. TOC assisting with coordination of care.                 -Patient stated that if he was unable to make medical decisions for himself, he would want his daughter Kennyth Arnold, to make medical decisions on his behalf.  Patient does have a long-term partner, [name], and notes that they are not married.                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   -Start OxyContin 10mg  q12hours scheduled    -Start home medication of percocet 7.5-325mg  q4hrs prn                  -Nausea/vomiting                               -As above, using dexamethasone                               -Change Reglan from IV to po 5 mg every 8 hours scheduled     -Continue Carafate 1gm TID and qhs     -Continue ativan 0.5mg  q4hrs prn solution    -Continue rifaximin 550mg  TID to assist with bacterial overgrowth that can worsen symptoms of bloating/gas                               -Continue Zofran 4 mg every 6 hours as needed                               -EKG on 8/2 showed QTc of 476                  -Constipation                               -  Discontinue Colace                               -Continue senna 2 tabs twice daily                                                  -Continue MiraLAX 17 g daily tomorrow                               -Continue  mineral oil enema daily as needed   # Psycho-social/Spiritual Support:  - Support System: daughterKennyth Arnold, long term significant other- [name]  # Discharge  Planning: Home with Hospice  -Consulted TOC to assist with coordination   Discussed with: patient, RN, hosptialist, TOC  Thank you for allowing the palliative care team to participate in the care Renley M Fields.  Alvester Morin, DO Palliative Care Provider PMT # [phone]  If patient remains symptomatic despite maximum doses, please call PMT at [phone] between 0700 and 1900. Outside of these hours, please call attending, as PMT does not have night coverage.  *Please note that this is a verbal dictation therefore any spelling or grammatical errors are due to the "[employer]" system interpretation.
